# Patient Record
Sex: Male | Born: 1935 | Race: White | Hispanic: No | Smoking: Former smoker
Health system: Southern US, Community
[De-identification: ages and names within clinical notes are randomized; demographics above are authoritative.]

## PROBLEM LIST (undated history)

## (undated) DIAGNOSIS — Z9889 Other specified postprocedural states: Secondary | ICD-10-CM

## (undated) DIAGNOSIS — E039 Hypothyroidism, unspecified: Secondary | ICD-10-CM

## (undated) DIAGNOSIS — T8859XA Other complications of anesthesia, initial encounter: Secondary | ICD-10-CM

## (undated) DIAGNOSIS — I4891 Unspecified atrial fibrillation: Secondary | ICD-10-CM

## (undated) DIAGNOSIS — M6530 Trigger finger, unspecified finger: Secondary | ICD-10-CM

## (undated) DIAGNOSIS — C801 Malignant (primary) neoplasm, unspecified: Secondary | ICD-10-CM

## (undated) DIAGNOSIS — N400 Enlarged prostate without lower urinary tract symptoms: Secondary | ICD-10-CM

## (undated) DIAGNOSIS — Z8489 Family history of other specified conditions: Secondary | ICD-10-CM

## (undated) DIAGNOSIS — I10 Essential (primary) hypertension: Secondary | ICD-10-CM

## (undated) DIAGNOSIS — M792 Neuralgia and neuritis, unspecified: Secondary | ICD-10-CM

## (undated) DIAGNOSIS — R112 Nausea with vomiting, unspecified: Secondary | ICD-10-CM

## (undated) DIAGNOSIS — T4145XA Adverse effect of unspecified anesthetic, initial encounter: Secondary | ICD-10-CM

## (undated) HISTORY — PX: JOINT REPLACEMENT: SHX530

## (undated) HISTORY — PX: KNEE SURGERY: SHX244

## (undated) HISTORY — PX: OTHER SURGICAL HISTORY: SHX169

## (undated) HISTORY — PX: CARPAL TUNNEL RELEASE: SHX101

## (undated) HISTORY — PX: APPENDECTOMY: SHX54

---

## 2013-04-12 ENCOUNTER — Encounter (HOSPITAL_BASED_OUTPATIENT_CLINIC_OR_DEPARTMENT_OTHER): Payer: Self-pay | Admitting: Emergency Medicine

## 2013-04-12 ENCOUNTER — Emergency Department (HOSPITAL_BASED_OUTPATIENT_CLINIC_OR_DEPARTMENT_OTHER)
Admission: EM | Admit: 2013-04-12 | Discharge: 2013-04-12 | Disposition: A | Discharge status: Home / Self Care | Payer: Medicare Other | Attending: Emergency Medicine | Admitting: Emergency Medicine

## 2013-04-12 DIAGNOSIS — R339 Retention of urine, unspecified: Secondary | ICD-10-CM | POA: Insufficient documentation

## 2013-04-12 DIAGNOSIS — Z9889 Other specified postprocedural states: Secondary | ICD-10-CM | POA: Insufficient documentation

## 2013-04-12 DIAGNOSIS — N390 Urinary tract infection, site not specified: Secondary | ICD-10-CM | POA: Insufficient documentation

## 2013-04-12 DIAGNOSIS — R42 Dizziness and giddiness: Secondary | ICD-10-CM | POA: Insufficient documentation

## 2013-04-12 DIAGNOSIS — J3489 Other specified disorders of nose and nasal sinuses: Secondary | ICD-10-CM | POA: Insufficient documentation

## 2013-04-12 DIAGNOSIS — M549 Dorsalgia, unspecified: Secondary | ICD-10-CM | POA: Insufficient documentation

## 2013-04-12 DIAGNOSIS — Z8669 Personal history of other diseases of the nervous system and sense organs: Secondary | ICD-10-CM | POA: Insufficient documentation

## 2013-04-12 DIAGNOSIS — R3911 Hesitancy of micturition: Secondary | ICD-10-CM | POA: Insufficient documentation

## 2013-04-12 DIAGNOSIS — J029 Acute pharyngitis, unspecified: Secondary | ICD-10-CM | POA: Insufficient documentation

## 2013-04-12 HISTORY — DX: Trigger finger, unspecified finger: M65.30

## 2013-04-12 HISTORY — DX: Neuralgia and neuritis, unspecified: M79.2

## 2013-04-12 LAB — URINALYSIS, ROUTINE W REFLEX MICROSCOPIC
BILIRUBIN URINE: NEGATIVE
Glucose, UA: NEGATIVE mg/dL
Hgb urine dipstick: NEGATIVE
Ketones, ur: NEGATIVE mg/dL
LEUKOCYTES UA: NEGATIVE
NITRITE: NEGATIVE
Protein, ur: NEGATIVE mg/dL
Specific Gravity, Urine: 1.007 (ref 1.005–1.030)
UROBILINOGEN UA: 1 mg/dL (ref 0.0–1.0)
pH: 7 (ref 5.0–8.0)

## 2013-04-12 NOTE — ED Notes (Signed)
Dr. Mingo Amber canceled the bladder scan on the patient.

## 2013-04-12 NOTE — Discharge Instructions (Signed)
Please stop take the cetirizine (Zyrtec) and do not take any other anti-histamine medications.  Acute Urinary Retention, Male Acute urinary retention is the temporary inability to urinate. This is a common problem in older men. As men age their prostates become larger and block the flow of urine from the bladder. This is usually a problem that has come on gradually.  HOME CARE INSTRUCTIONS If you are sent home with a Foley catheter and a drainage system, you will need to discuss the best course of action with your health care provider. While the catheter is in, maintain a good intake of fluids. Keep the drainage bag emptied and lower than your catheter. This is so that contaminated urine will not flow back into your bladder, which could lead to a urinary tract infection. There are two main types of drainage bags. One is a large bag that usually is used at night. It has a good capacity that will allow you to sleep through the night without having to empty it. The second type is called a leg bag. It has a smaller capacity, so it needs to be emptied more frequently. However, the main advantage is that it can be attached by a leg strap and can go underneath your clothing, allowing you the freedom to move about or leave your home. Only take over-the-counter or prescription medicines for pain, discomfort, or fever as directed by your health care provider.  SEEK MEDICAL CARE IF:  You develop a low-grade fever.  You experience spasms or leakage of urine with the spasms. SEEK IMMEDIATE MEDICAL CARE IF:   You develop chills or fever.  Your catheter stops draining urine.  Your catheter falls out.  You start to develop increased bleeding that does not respond to rest and increased fluid intake. MAKE SURE YOU:  Understand these instructions.  Will watch your condition.  Will get help right away if you are not doing well or get worse. Document Released: 07/02/2000 Document Revised: 11/26/2012  Document Reviewed: 09/04/2012 [facility] Patient Information 2014 [place], [place].

## 2013-04-12 NOTE — ED Notes (Signed)
Pt started with a cold on Monday, pt states he had difficulty with urination yesterday, pt states he wasn't peeing.  Today pt feels weak and achy all over.  No facial droop, no aphasia, no arm drift.

## 2013-04-12 NOTE — ED Provider Notes (Signed)
CSN: [id number]     Arrival date & time 04/12/13  4098 History   First MD Initiated Contact with Patient 04/12/13 [phone]     No chief complaint on file.  (Consider location/radiation/quality/duration/timing/severity/associated sxs/prior Treatment) HPI Comments: Patient has had a URI for the past 6 days with sore throat, runny nose. He denies fevers or chills. He started Zyrtec and tramadol for this and over the past 2 days has noticed difficulty urinating. He has difficulty initiating his stream he can get small amounts when he does. He has history of enlarged prostate but has never had issues with urinary retention requiring catheterization. He also reported some dizziness this morning he described as lightheadedness. He has chronic dizziness from Mnire's disease and he is currently being worked up for balance issues with her neurologist in [place]. He has an MRI scheduled to help figure out his balance issues in the next week. He stated his blood pressure this morning was 86/59 while he was sleeping/resting in a chair. He ate breakfast and took his blood pressure meds. He denies dizziness now. He was able to stand up and move around the room without any dizziness at this time. After taking his blood pressure meds, his blood pressure here is 119/84.  He is also complaining of back pain. He had back surgery 2 years ago and has regular backaches. This began this morning and feels like her regular backache for him.  Patient is a 80 y.o. male presenting with male genitourinary complaint. The history is provided by the patient.  Male GU Problem Presenting symptoms: no dysuria, no penile discharge, no penile pain and no scrotal pain   Presenting symptoms comment:  Urinary retention Context: not after injury and not after intercourse   Relieved by:  Nothing Worsened by:  Nothing tried Ineffective treatments:  None tried Associated symptoms: urinary hesitation   Associated symptoms: no abdominal pain,  no diarrhea, no fever, no genital rash, no groin pain, no urinary frequency, no urinary incontinence and no vomiting   Risk factors: change in medication (recent initiation of tramadol and zyrtec for a URI)   Risk factors: no bladder surgery, no foreign body lodged, no recent infection, not currently sexually active and no urinary catheter     Past Medical History  Diagnosis Date  . Trigger finger   . Peripheral neuralgia    No past surgical history on file. No family history on file. History  Substance Use Topics  . Smoking status: Not on file  . Smokeless tobacco: Not on file  . Alcohol Use: Not on file    Review of Systems  Constitutional: Negative for fever.  Gastrointestinal: Negative for vomiting, abdominal pain and diarrhea.  Genitourinary: Positive for hesitancy. Negative for bladder incontinence, dysuria, frequency, discharge and penile pain.  All other systems reviewed and are negative.    Allergies  Review of patient's allergies indicates not on file.  Home Medications  No current outpatient prescriptions on file. BP 119/84  Pulse 98  Temp(Src) 97.4 F (36.3 C) (Oral)  Resp 14  Ht 5\' 11"  (1.803 m)  Wt 212 lb (96.163 kg)  BMI 29.58 kg/m2  SpO2 98% Physical Exam  Nursing note and vitals reviewed. Constitutional: He is oriented to person, place, and time. He appears well-developed and well-nourished. No distress.  HENT:  Head: Normocephalic and atraumatic.  Mouth/Throat: No oropharyngeal exudate.  Eyes: EOM are normal. Pupils are equal, round, and reactive to light.  Neck: Normal range of motion. Neck supple.  Cardiovascular: Normal rate and regular rhythm.  Exam reveals no friction rub.   No murmur heard. Pulmonary/Chest: Effort normal and breath sounds normal. No respiratory distress. He has no wheezes. He has no rales.  Abdominal: Soft. He exhibits no distension. There is no tenderness. There is no rebound.  Genitourinary: Prostate is enlarged (mild).  Prostate is not tender.  Musculoskeletal: Normal range of motion. He exhibits no edema.  Neurological: He is alert and oriented to person, place, and time.  Skin: No rash noted. He is not diaphoretic.    ED Course  Procedures (including critical care time) Labs Review Labs Reviewed  URINALYSIS, ROUTINE W REFLEX MICROSCOPIC   Imaging Review No results found.  EKG Interpretation   None      EMERGENCY DEPARTMENT [place] ABD/AORTA EXAM Study: Limited Ultrasound of the Abdominal Aorta. INDICATIONS:Abdominal pain Indication: Multiple views of the abdominal aorta are obtained from the diaphragmatic hiatus to the aortic bifurcation in transverse and sagittal planes with a multi- Frequency probe. PERFORMED BY: Myself IMAGES ARCHIVED?: Yes FINDINGS: Maximum aortic dimensions are 2.2.cm LIMITATIONS:  Bowel gas  INTERPRETATION:  No abdominal aortic aneurysm  EMERGENCY DEPARTMENT BLADDER US Indications: Post-void residual, urinary retention Multiple views of the bladder were obtained in sagittal and transverse orientation with a cardiac phase, array probe PERFORMED BY: Myself IMAGES ARCHIVED: YES FINDINGS: Bladder volume ~293cc  Interpretation: Bladder distended with roughly ~293cc of urine post-void  MDM   1. Urinary retention with incomplete bladder emptying    80 year old male here with concerns for urinary retention. I believe it is likely because he  started Zyrtec and tramadol in the past 2 days for UTI. He denies fevers, dysuria. He does have difficulty with initiating a stream and is only able to get small amounts out. History of an enlarged prostate, but has never required catheterization. As far as his dizziness described, is now resolved. He is able to stand up without any dizziness. He also has chronic dizziness secondary to his Mnire's disease and is actively pursuing workup for this as an outpatient. His vitals are stable. He has no chest pain or shortness of breath. We'll  also scan his bladder to see if he is retaining urine. I will also ultrasound his aorta to look for possible rebleed reason for his urinary retention and/or back pain. Ultrasound of his aorta is normal without aneurysm. Post void residual ultrasound 293cc. Patient denies any saddle anesthesia, lower extremity weakness, sensation changes. 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 No concern for cord compression syndrome. I feel like this is secondary to his antihistamine medication. His back pain is resolved now, believed secondary to his normal backache from his back surgery 2 years ago. Patient has multiple followup visits this week. Patient refused catheter for urinary retention. He again urinated 100cc after I finished my ultrasounds. Instructed him to followup with PCP for recheck    Osvaldo Shipper, MD 04/12/13 1035

## 2016-07-18 ENCOUNTER — Emergency Department (HOSPITAL_BASED_OUTPATIENT_CLINIC_OR_DEPARTMENT_OTHER): Payer: Medicare Other

## 2016-07-18 ENCOUNTER — Observation Stay (HOSPITAL_BASED_OUTPATIENT_CLINIC_OR_DEPARTMENT_OTHER)
Admission: EM | Admit: 2016-07-18 | Discharge: 2016-07-20 | Disposition: A | Discharge status: Home Health | Payer: Medicare Other | Attending: Family Medicine | Admitting: Family Medicine

## 2016-07-18 ENCOUNTER — Encounter (HOSPITAL_BASED_OUTPATIENT_CLINIC_OR_DEPARTMENT_OTHER): Payer: Self-pay | Admitting: *Deleted

## 2016-07-18 DIAGNOSIS — R000 Tachycardia, unspecified: Secondary | ICD-10-CM | POA: Diagnosis not present

## 2016-07-18 DIAGNOSIS — Z7982 Long term (current) use of aspirin: Secondary | ICD-10-CM | POA: Diagnosis not present

## 2016-07-18 DIAGNOSIS — E860 Dehydration: Secondary | ICD-10-CM | POA: Diagnosis not present

## 2016-07-18 DIAGNOSIS — R002 Palpitations: Secondary | ICD-10-CM

## 2016-07-18 DIAGNOSIS — R531 Weakness: Secondary | ICD-10-CM | POA: Diagnosis present

## 2016-07-18 DIAGNOSIS — I10 Essential (primary) hypertension: Secondary | ICD-10-CM | POA: Diagnosis not present

## 2016-07-18 DIAGNOSIS — Z79899 Other long term (current) drug therapy: Secondary | ICD-10-CM | POA: Diagnosis not present

## 2016-07-18 DIAGNOSIS — N179 Acute kidney failure, unspecified: Secondary | ICD-10-CM | POA: Diagnosis not present

## 2016-07-18 DIAGNOSIS — E785 Hyperlipidemia, unspecified: Secondary | ICD-10-CM | POA: Insufficient documentation

## 2016-07-18 DIAGNOSIS — Z85819 Personal history of malignant neoplasm of unspecified site of lip, oral cavity, and pharynx: Secondary | ICD-10-CM | POA: Diagnosis present

## 2016-07-18 DIAGNOSIS — M6281 Muscle weakness (generalized): Secondary | ICD-10-CM | POA: Diagnosis not present

## 2016-07-18 DIAGNOSIS — Z87891 Personal history of nicotine dependence: Secondary | ICD-10-CM | POA: Diagnosis not present

## 2016-07-18 DIAGNOSIS — R262 Difficulty in walking, not elsewhere classified: Secondary | ICD-10-CM | POA: Diagnosis not present

## 2016-07-18 DIAGNOSIS — N400 Enlarged prostate without lower urinary tract symptoms: Secondary | ICD-10-CM | POA: Diagnosis not present

## 2016-07-18 DIAGNOSIS — R2681 Unsteadiness on feet: Secondary | ICD-10-CM | POA: Diagnosis not present

## 2016-07-18 DIAGNOSIS — D649 Anemia, unspecified: Secondary | ICD-10-CM | POA: Diagnosis present

## 2016-07-18 DIAGNOSIS — G5602 Carpal tunnel syndrome, left upper limb: Secondary | ICD-10-CM | POA: Insufficient documentation

## 2016-07-18 DIAGNOSIS — Z9181 History of falling: Secondary | ICD-10-CM | POA: Insufficient documentation

## 2016-07-18 DIAGNOSIS — I480 Paroxysmal atrial fibrillation: Secondary | ICD-10-CM | POA: Diagnosis not present

## 2016-07-18 DIAGNOSIS — Z923 Personal history of irradiation: Secondary | ICD-10-CM | POA: Diagnosis not present

## 2016-07-18 HISTORY — DX: Family history of other specified conditions: Z84.89

## 2016-07-18 HISTORY — DX: Other specified postprocedural states: Z98.890

## 2016-07-18 HISTORY — DX: Benign prostatic hyperplasia without lower urinary tract symptoms: N40.0

## 2016-07-18 HISTORY — DX: Essential (primary) hypertension: I10

## 2016-07-18 HISTORY — DX: Other complications of anesthesia, initial encounter: T88.59XA

## 2016-07-18 HISTORY — DX: Malignant (primary) neoplasm, unspecified: C80.1

## 2016-07-18 HISTORY — DX: Unspecified atrial fibrillation: I48.91

## 2016-07-18 HISTORY — DX: Hypothyroidism, unspecified: E03.9

## 2016-07-18 HISTORY — DX: Adverse effect of unspecified anesthetic, initial encounter: T41.45XA

## 2016-07-18 HISTORY — DX: Nausea with vomiting, unspecified: R11.2

## 2016-07-18 HISTORY — DX: Nausea with vomiting, unspecified: Z98.890

## 2016-07-18 LAB — CBC WITH DIFFERENTIAL/PLATELET
Basophils Absolute: 0 10*3/uL (ref 0.0–0.1)
Basophils Relative: 0 %
EOS PCT: 2 %
Eosinophils Absolute: 0.2 10*3/uL (ref 0.0–0.7)
HCT: 37.4 % — ABNORMAL LOW (ref 39.0–52.0)
Hemoglobin: 12.7 g/dL — ABNORMAL LOW (ref 13.0–17.0)
LYMPHS PCT: 25 %
Lymphs Abs: 1.7 10*3/uL (ref 0.7–4.0)
MCH: 32.6 pg (ref 26.0–34.0)
MCHC: 34 g/dL (ref 30.0–36.0)
MCV: 95.9 fL (ref 78.0–100.0)
Monocytes Absolute: 0.6 10*3/uL (ref 0.1–1.0)
Monocytes Relative: 8 %
Neutro Abs: 4.3 10*3/uL (ref 1.7–7.7)
Neutrophils Relative %: 65 %
PLATELETS: 171 10*3/uL (ref 150–400)
RBC: 3.9 MIL/uL — AB (ref 4.22–5.81)
RDW: 13 % (ref 11.5–15.5)
WBC: 6.7 10*3/uL (ref 4.0–10.5)

## 2016-07-18 LAB — BASIC METABOLIC PANEL
Anion gap: 6 (ref 5–15)
BUN: 32 mg/dL — ABNORMAL HIGH (ref 6–20)
CO2: 28 mmol/L (ref 22–32)
Calcium: 8.9 mg/dL (ref 8.9–10.3)
Chloride: 105 mmol/L (ref 101–111)
Creatinine, Ser: 1.74 mg/dL — ABNORMAL HIGH (ref 0.61–1.24)
GFR calc Af Amer: 40 mL/min — ABNORMAL LOW (ref >60)
GFR, EST NON AFRICAN AMERICAN: 35 mL/min — AB (ref >60)
Glucose, Bld: 111 mg/dL — ABNORMAL HIGH (ref 65–99)
Potassium: 3.5 mmol/L (ref 3.5–5.1)
Sodium: 139 mmol/L (ref 135–145)

## 2016-07-18 NOTE — ED Triage Notes (Signed)
Pts daughter states generalized weakness started at 2030, unsteady gait.

## 2016-07-18 NOTE — ED Provider Notes (Signed)
[facility] Provider Note   CSN: [id number] Arrival date & time: 07/18/16  2244     History   Chief Complaint Chief Complaint  Patient presents with  . Weakness    HPI Corey Marshall is a 83 y.o. male.  The history is provided by the patient and a relative. No language interpreter was used.  Weakness  Primary symptoms include no focal weakness, no loss of sensation, no memory loss, no auditory change, and no dizziness. Primary symptoms comment: global weakness. This is a new problem. The current episode started 6 to 12 hours ago. The problem has not changed since onset.There was no focality noted. There has been no fever. Associated symptoms include confusion. Pertinent negatives include no shortness of breath, no chest pain, no vomiting, no altered mental status and no headaches. There were no medications administered prior to arrival. Associated medical issues include trauma. Associated medical issues do not include CVA. Associated medical issues comments: fall.  Palpitations   This is a new problem. The current episode started 3 to 5 hours ago. The problem occurs rarely. The problem has been resolved. The problem is associated with an unknown factor. Associated symptoms include weakness. Pertinent negatives include no diaphoresis, no chest pain, no chest pressure, no syncope, no abdominal pain, no nausea, no vomiting, no headaches, no dizziness, no cough, no hemoptysis and no shortness of breath. Treatments tried: increased metoprolol. The treatment provided significant relief. Risk factors include being male. His past medical history does not include hyperthyroidism.    Past Medical History:  Diagnosis Date  . Atrial fibrillation ([place])   . Hypertension   . Peripheral neuralgia   . Trigger finger     There are no active problems to display for this patient.   History reviewed. No pertinent surgical history.     Home Medications    Prior to Admission  medications   Medication Sig Start Date End Date Taking? Authorizing Provider  AMLODIPINE BESYLATE-VALSARTAN PO Take 5 mg by mouth daily.    Historical Provider, MD  aspirin 325 MG tablet Take 325 mg by mouth daily.    Historical Provider, MD  cetirizine-pseudoephedrine (ZYRTEC-D) 5-120 MG per tablet Take 1 tablet by mouth 2 (two) times daily.    Historical Provider, MD  cyanocobalamin 1000 MCG tablet Take 100 mcg by mouth daily.    Historical Provider, MD  folic acid (FOLVITE) 938 MCG tablet Take 400 mcg by mouth daily.    Historical Provider, MD  hydrochlorothiazide (HYDRODIURIL) 25 MG tablet Take 25 mg by mouth daily.    Historical Provider, MD  levothyroxine (SYNTHROID, LEVOTHROID) 50 MCG tablet Take 25 mcg by mouth daily before breakfast.    Historical Provider, MD  meclizine (ANTIVERT) 25 MG tablet Take 25 mg by mouth 3 (three) times daily as needed for dizziness.    Historical Provider, MD  meloxicam (MOBIC) 15 MG tablet Take 15 mg by mouth daily.    Historical Provider, MD  metoprolol (LOPRESSOR) 100 MG tablet Take 100 mg by mouth 2 (two) times daily.    Historical Provider, MD  Multiple Vitamins-Minerals (MULTIVITAMIN WITH MINERALS) tablet Take 1 tablet by mouth daily.    Historical Provider, MD  ranitidine (ZANTAC) 150 MG tablet Take 150 mg by mouth 2 (two) times daily.    Historical Provider, MD  traMADol (ULTRAM) 50 MG tablet Take 50 mg by mouth every 6 (six) hours as needed.    Historical Provider, MD    Family History History reviewed. No  pertinent family history.  Social History Social History  Substance Use Topics  . Smoking status: Former Research scientist (life sciences)  . Smokeless tobacco: Not on file  . Alcohol use No     Allergies   Darvon [propoxyphene] and Demerol [meperidine]   Review of Systems Review of Systems  Constitutional: Negative for diaphoresis.  Respiratory: Negative for cough, hemoptysis and shortness of breath.   Cardiovascular: Positive for palpitations. Negative for  chest pain and syncope.  Gastrointestinal: Negative for abdominal pain, nausea and vomiting.  Neurological: Positive for weakness. Negative for dizziness, focal weakness and headaches.  Psychiatric/Behavioral: Positive for confusion. Negative for memory loss.  All other systems reviewed and are negative.    Physical Exam Updated Vital Signs BP 118/78   Pulse 97   Temp 98.7 F (37.1 C) (Oral)   Resp 16   Ht 5\' 11"  (1.803 m)   Wt 210 lb (95.3 kg)   SpO2 99%   BMI 29.29 kg/m   Physical Exam  Constitutional: He is oriented to person, place, and time. He appears well-developed and well-nourished. No distress.  HENT:  Head: Normocephalic and atraumatic.  Right Ear: External ear normal.  Left Ear: External ear normal.  Nose: Nose normal.  Eyes: Conjunctivae and EOM are normal. Pupils are equal, round, and reactive to light.  Neck: Normal range of motion. Neck supple. No JVD present.  Cardiovascular: Normal rate, regular rhythm, normal heart sounds and intact distal pulses.   Pulmonary/Chest: Effort normal and breath sounds normal. No stridor. No respiratory distress. He has no wheezes.  Abdominal: Soft. He exhibits no mass. There is no tenderness. There is no rebound and no guarding.  Gassy into the chest cavity  Musculoskeletal: Normal range of motion. He exhibits no edema or tenderness.  Neurological: He is alert and oriented to person, place, and time. He displays normal reflexes. No cranial nerve deficit. He exhibits normal muscle tone. Coordination normal.  Skin: Skin is warm and dry. Capillary refill takes less than 2 seconds.  Psychiatric: He has a normal mood and affect.     ED Treatments / Results   Vitals:   07/18/16 2253  BP: 118/78  Pulse: 97  Resp: 16  Temp: 98.7 F (37.1 C)    EKG Interpretation  Date/Time:  Wednesday Layli Capshaw 11 2018 23:57:46 EDT Ventricular Rate:  72 PR Interval:    QRS Duration: 107 QT Interval:  407 QTC Calculation: 446 R  Axis:   -36 Text Interpretation:  Sinus rhythm Left axis deviation Confirmed by [facility]  MD, Lorita Forinash ([id number]) on 07/19/2016 12:54:21 AM      Results for orders placed or performed during the hospital encounter of 07/18/16  CBC with Differential/Platelet  Result Value Ref Range   WBC 6.7 4.0 - 10.5 K/uL   RBC 3.90 (L) 4.22 - 5.81 MIL/uL   Hemoglobin 12.7 (L) 13.0 - 17.0 g/dL   HCT 37.4 (L) 39.0 - 52.0 %   MCV 95.9 78.0 - 100.0 fL   MCH 32.6 26.0 - 34.0 pg   MCHC 34.0 30.0 - 36.0 g/dL   RDW 13.0 11.5 - 15.5 %   Platelets 171 150 - 400 K/uL   Neutrophils Relative % 65 %   Neutro Abs 4.3 1.7 - 7.7 K/uL   Lymphocytes Relative 25 %   Lymphs Abs 1.7 0.7 - 4.0 K/uL   Monocytes Relative 8 %   Monocytes Absolute 0.6 0.1 - 1.0 K/uL   Eosinophils Relative 2 %   Eosinophils Absolute 0.2 0.0 -  0.7 K/uL   Basophils Relative 0 %   Basophils Absolute 0.0 0.0 - 0.1 K/uL  Basic metabolic panel  Result Value Ref Range   Sodium 139 135 - 145 mmol/L   Potassium 3.5 3.5 - 5.1 mmol/L   Chloride 105 101 - 111 mmol/L   CO2 28 22 - 32 mmol/L   Glucose, Bld 111 (H) 65 - 99 mg/dL   BUN 32 (H) 6 - 20 mg/dL   Creatinine, Ser 1.74 (H) 0.61 - 1.24 mg/dL   Calcium 8.9 8.9 - 10.3 mg/dL   GFR calc non Af Amer 35 (L) >60 mL/min   GFR calc Af Amer 40 (L) >60 mL/min   Anion gap 6 5 - 15  Urinalysis, Routine w reflex microscopic  Result Value Ref Range   Color, Urine YELLOW YELLOW   APPearance CLEAR CLEAR   Specific Gravity, Urine 1.018 1.005 - 1.030   pH 6.0 5.0 - 8.0   Glucose, UA NEGATIVE NEGATIVE mg/dL   Hgb urine dipstick NEGATIVE NEGATIVE   Bilirubin Urine NEGATIVE NEGATIVE   Ketones, ur NEGATIVE NEGATIVE mg/dL   Protein, ur NEGATIVE NEGATIVE mg/dL   Nitrite NEGATIVE NEGATIVE   Leukocytes, UA NEGATIVE NEGATIVE  Troponin I  Result Value Ref Range   Troponin I 0.03 (HH) <0.03 ng/mL   Dg Chest 2 View  Result Date: 07/18/2016 CLINICAL DATA:  Weakness and unsteady gait. History of throat  cancer. EXAM: CHEST  2 VIEW COMPARISON:  None. FINDINGS: The heart size and mediastinal contours are within normal limits. Atherosclerosis of the aortic arch without aneurysm. Both lungs are clear. Degenerative changes are seen along the dorsal spine. No acute nor suspicious osseous abnormality. IMPRESSION: No active cardiopulmonary disease.  Aortic atherosclerosis Electronically Signed   By: Ashley Royalty M.D.   On: 07/18/2016 23:45   Ct Head Wo Contrast  Result Date: 07/18/2016 CLINICAL DATA:  Weakness and unsteady gait. History of throat cancer. EXAM: CT HEAD WITHOUT CONTRAST TECHNIQUE: Contiguous axial images were obtained from the base of the skull through the vertex without intravenous contrast. COMPARISON:  None. FINDINGS: Brain: Chronic appearing small vessel ischemic disease of periventricular white matter. Mild superficial and central atrophy. No intra-axial mass, hemorrhage nor extra-axial fluid collections. No hydrocephalus. Fourth ventricle is midline. No effacement of basal cisterns. Vascular: No hyperdense vessel or unexpected calcification. Mild atherosclerosis of the carotid siphons. Skull: Normal. Negative for fracture or focal lesion. Sinuses/Orbits: No acute finding. Other: None. IMPRESSION: Chronic appearing small vessel ischemic disease of periventricular white matter. No acute intracranial abnormality is identified. Electronically Signed   By: Ashley Royalty M.D.   On: 07/18/2016 23:47    Procedures Procedures (including critical care time)  Medications Ordered in ED  Medications  sodium chloride 0.9 % bolus 500 mL (0 mLs Intravenous Stopped 07/19/16 0213)    Per Dr. Leonel Ramsay MRI of the brain sx likely due to hypotension.     Final Clinical Impressions(s) / ED Diagnoses  Global weakness:  Will admit to medicine.   Will need troponins cycled and MRI of the brain.       Veatrice Kells, MD 07/19/16 Rogene [place]

## 2016-07-19 ENCOUNTER — Observation Stay (HOSPITAL_COMMUNITY): Payer: Medicare Other

## 2016-07-19 ENCOUNTER — Encounter (HOSPITAL_COMMUNITY): Payer: Self-pay | Admitting: Internal Medicine

## 2016-07-19 DIAGNOSIS — I480 Paroxysmal atrial fibrillation: Secondary | ICD-10-CM | POA: Diagnosis not present

## 2016-07-19 DIAGNOSIS — R531 Weakness: Secondary | ICD-10-CM

## 2016-07-19 DIAGNOSIS — N179 Acute kidney failure, unspecified: Secondary | ICD-10-CM | POA: Diagnosis present

## 2016-07-19 DIAGNOSIS — I10 Essential (primary) hypertension: Secondary | ICD-10-CM | POA: Diagnosis not present

## 2016-07-19 DIAGNOSIS — D649 Anemia, unspecified: Secondary | ICD-10-CM | POA: Diagnosis present

## 2016-07-19 DIAGNOSIS — Z85819 Personal history of malignant neoplasm of unspecified site of lip, oral cavity, and pharynx: Secondary | ICD-10-CM | POA: Diagnosis present

## 2016-07-19 LAB — CBC WITH DIFFERENTIAL/PLATELET
Basophils Absolute: 0 10*3/uL (ref 0.0–0.1)
Basophils Relative: 0 %
EOS ABS: 0.1 10*3/uL (ref 0.0–0.7)
Eosinophils Relative: 3 %
HCT: 33.7 % — ABNORMAL LOW (ref 39.0–52.0)
HEMOGLOBIN: 11.5 g/dL — AB (ref 13.0–17.0)
LYMPHS ABS: 1.5 10*3/uL (ref 0.7–4.0)
Lymphocytes Relative: 32 %
MCH: 32.2 pg (ref 26.0–34.0)
MCHC: 34.1 g/dL (ref 30.0–36.0)
MCV: 94.4 fL (ref 78.0–100.0)
MONOS PCT: 7 %
Monocytes Absolute: 0.3 10*3/uL (ref 0.1–1.0)
NEUTROS ABS: 2.8 10*3/uL (ref 1.7–7.7)
NEUTROS PCT: 58 %
Platelets: 162 10*3/uL (ref 150–400)
RBC: 3.57 MIL/uL — ABNORMAL LOW (ref 4.22–5.81)
RDW: 13.7 % (ref 11.5–15.5)
WBC: 4.8 10*3/uL (ref 4.0–10.5)

## 2016-07-19 LAB — COMPREHENSIVE METABOLIC PANEL
ALBUMIN: 3.3 g/dL — AB (ref 3.5–5.0)
ALT: 25 U/L (ref 17–63)
AST: 39 U/L (ref 15–41)
Alkaline Phosphatase: 41 U/L (ref 38–126)
Anion gap: 7 (ref 5–15)
BUN: 25 mg/dL — ABNORMAL HIGH (ref 6–20)
CHLORIDE: 106 mmol/L (ref 101–111)
CO2: 27 mmol/L (ref 22–32)
CREATININE: 1.39 mg/dL — AB (ref 0.61–1.24)
Calcium: 8.7 mg/dL — ABNORMAL LOW (ref 8.9–10.3)
GFR calc non Af Amer: 45 mL/min — ABNORMAL LOW (ref >60)
GFR, EST AFRICAN AMERICAN: 52 mL/min — AB (ref >60)
GLUCOSE: 109 mg/dL — AB (ref 65–99)
Potassium: 3.3 mmol/L — ABNORMAL LOW (ref 3.5–5.1)
SODIUM: 140 mmol/L (ref 135–145)
Total Bilirubin: 1.2 mg/dL (ref 0.3–1.2)
Total Protein: 5.2 g/dL — ABNORMAL LOW (ref 6.5–8.1)

## 2016-07-19 LAB — RETICULOCYTES
RBC.: 3.57 MIL/uL — ABNORMAL LOW (ref 4.22–5.81)
Retic Count, Absolute: 32.1 10*3/uL (ref 19.0–186.0)
Retic Ct Pct: 0.9 % (ref 0.4–3.1)

## 2016-07-19 LAB — IRON AND TIBC
IRON: 53 ug/dL (ref 45–182)
Saturation Ratios: 19 % (ref 17.9–39.5)
TIBC: 276 ug/dL (ref 250–450)
UIBC: 223 ug/dL

## 2016-07-19 LAB — URINALYSIS, ROUTINE W REFLEX MICROSCOPIC
BILIRUBIN URINE: NEGATIVE
GLUCOSE, UA: NEGATIVE mg/dL
Hgb urine dipstick: NEGATIVE
Ketones, ur: NEGATIVE mg/dL
Leukocytes, UA: NEGATIVE
Nitrite: NEGATIVE
PH: 6 (ref 5.0–8.0)
Protein, ur: NEGATIVE mg/dL
SPECIFIC GRAVITY, URINE: 1.018 (ref 1.005–1.030)

## 2016-07-19 LAB — CK: Total CK: 513 U/L — ABNORMAL HIGH (ref 49–397)

## 2016-07-19 LAB — TROPONIN I
TROPONIN I: 0.03 ng/mL — AB (ref <0.03)
Troponin I: 0.03 ng/mL (ref <0.03)
Troponin I: 0.03 ng/mL (ref <0.03)
Troponin I: 0.03 ng/mL (ref <0.03)

## 2016-07-19 LAB — TSH: TSH: 4.757 u[IU]/mL — ABNORMAL HIGH (ref 0.350–4.500)

## 2016-07-19 LAB — FOLATE: Folate: 42.9 ng/mL (ref >5.9)

## 2016-07-19 LAB — VITAMIN B12: Vitamin B-12: 630 pg/mL (ref 180–914)

## 2016-07-19 LAB — FERRITIN: FERRITIN: 64 ng/mL (ref 24–336)

## 2016-07-19 MED ORDER — SODIUM CHLORIDE 0.9 % IV BOLUS (SEPSIS)
500.0000 mL | Freq: Once | INTRAVENOUS | Status: AC
  Administered 2016-07-19: 500 mL via INTRAVENOUS

## 2016-07-19 MED ORDER — ONDANSETRON HCL 4 MG/2ML IJ SOLN: 4.0000 mg | Freq: Four times a day (QID) | INTRAMUSCULAR | Status: DC | PRN

## 2016-07-19 MED ORDER — FINASTERIDE 5 MG PO TABS
5.0000 mg | ORAL_TABLET | Freq: Every day | ORAL | Status: DC
  Administered 2016-07-19 – 2016-07-20 (×2): 5 mg via ORAL
  Filled 2016-07-19 (×2): qty 1

## 2016-07-19 MED ORDER — FAMOTIDINE 20 MG PO TABS
20.0000 mg | ORAL_TABLET | Freq: Every day | ORAL | Status: DC
  Administered 2016-07-19: 20 mg via ORAL
  Filled 2016-07-19: qty 1

## 2016-07-19 MED ORDER — ACETAMINOPHEN 650 MG RE SUPP: 650.0000 mg | Freq: Four times a day (QID) | RECTAL | Status: DC | PRN

## 2016-07-19 MED ORDER — ASPIRIN 325 MG PO TABS
325.0000 mg | ORAL_TABLET | Freq: Every day | ORAL | Status: DC
  Administered 2016-07-19 – 2016-07-20 (×2): 325 mg via ORAL
  Filled 2016-07-19 (×2): qty 1

## 2016-07-19 MED ORDER — FAMOTIDINE 20 MG PO TABS: 20.0000 mg | ORAL_TABLET | Freq: Every day | ORAL | Status: DC

## 2016-07-19 MED ORDER — METOPROLOL TARTRATE 12.5 MG HALF TABLET
12.5000 mg | ORAL_TABLET | Freq: Two times a day (BID) | ORAL | Status: DC
  Administered 2016-07-19 – 2016-07-20 (×3): 12.5 mg via ORAL
  Filled 2016-07-19 (×3): qty 1

## 2016-07-19 MED ORDER — ENOXAPARIN SODIUM 40 MG/0.4ML ~~LOC~~ SOLN
40.0000 mg | SUBCUTANEOUS | Status: DC
  Administered 2016-07-19 – 2016-07-20 (×2): 40 mg via SUBCUTANEOUS
  Filled 2016-07-19 (×2): qty 0.4

## 2016-07-19 MED ORDER — ACETAMINOPHEN 325 MG PO TABS: 650.0000 mg | ORAL_TABLET | Freq: Four times a day (QID) | ORAL | Status: DC | PRN

## 2016-07-19 MED ORDER — LEVOTHYROXINE SODIUM 75 MCG PO TABS
37.5000 ug | ORAL_TABLET | Freq: Every day | ORAL | Status: DC
  Administered 2016-07-19 – 2016-07-20 (×2): 37.5 ug via ORAL
  Filled 2016-07-19 (×2): qty 1

## 2016-07-19 MED ORDER — SODIUM CHLORIDE 0.9 % IV SOLN
INTRAVENOUS | Status: AC
  Administered 2016-07-19: 75 mL via INTRAVENOUS
  Administered 2016-07-19: 06:00:00 via INTRAVENOUS

## 2016-07-19 MED ORDER — ONDANSETRON HCL 4 MG PO TABS: 4.0000 mg | ORAL_TABLET | Freq: Four times a day (QID) | ORAL | Status: DC | PRN

## 2016-07-19 MED ORDER — TAMSULOSIN HCL 0.4 MG PO CAPS
0.4000 mg | ORAL_CAPSULE | Freq: Every day | ORAL | Status: DC
  Administered 2016-07-19: 0.4 mg via ORAL
  Filled 2016-07-19: qty 1

## 2016-07-19 MED ORDER — FOLIC ACID 1 MG PO TABS
500.0000 ug | ORAL_TABLET | Freq: Every day | ORAL | Status: DC
  Administered 2016-07-19 – 2016-07-20 (×2): 0.5 mg via ORAL
  Filled 2016-07-19 (×2): qty 1

## 2016-07-19 MED ORDER — VITAMIN B-12 100 MCG PO TABS
100.0000 ug | ORAL_TABLET | Freq: Every day | ORAL | Status: DC
  Administered 2016-07-19 – 2016-07-20 (×2): 100 ug via ORAL
  Filled 2016-07-19 (×2): qty 1

## 2016-07-19 NOTE — Evaluation (Signed)
Physical Therapy Evaluation Patient Details Name: Corey Marshall MRN: [id number] DOB: [birth year] Today's Date: 07/19/2016   History of Present Illness  83 y.o. male with history of paroxysmal atrial fibrillation on metoprolol and aspirin, hypertension, BPH and history of throat cancer status post surgery and radiation was brought to the ER after patient was having persistent weakness and tachycardia. Patient states around 8 PM last night when patient was trying to stand up from the chair patient started feeling weak and had to sit back. Weakness continued for almost 2 hours. For a brief period patient had blurred vision lasted for around 4-5 minutes.  Dx of acute renal failure, a fib, elevated troponin. CT head negative.  Clinical Impression  Pt admitted with above diagnosis. Pt currently with functional limitations due to the deficits listed below (see PT Problem List). Pt ambulated 150' without assistive device and had loss of balance x 1. He reports long standing h/o numbness in B feet, L foot drop, and balance difficulties. Using a RW pt was much steadier. He'd benefit from RW and HHPT for home safety assessment and balance training.   Pt will benefit from skilled PT to increase their independence and safety with mobility to allow discharge to the venue listed below.       Follow Up Recommendations Home health PT    Equipment Recommendations  Rolling walker with 5" wheels    Recommendations for Other Services       Precautions / Restrictions Precautions Precautions: Fall Precaution Comments: pt reports 2 falls in past year, family states there have been more than 2 Restrictions Weight Bearing Restrictions: No      Mobility  Bed Mobility Overal bed mobility: Independent                Transfers Overall transfer level: Independent Equipment used: None                Ambulation/Gait Ambulation/Gait assistance: Supervision;Min guard Ambulation Distance (Feet): 150  Feet (150' x 2, once without AD, once with RW) Assistive device: Rolling walker (2 wheeled);None Gait Pattern/deviations: Step-through pattern;Decreased dorsiflexion - left   Gait velocity interpretation: at or above normal speed for age/gender General Gait Details: 150' x 2 (once without AD, once with RW), steppage gait LLE 2* foot drop, mild LOB with turning walking without AD -pt able to recover without assistance; ambulation with RW was more steady with no LOB  Stairs            Wheelchair Mobility    Modified Rankin (Stroke Patients Only)       Balance Overall balance assessment: Modified Independent                                           Pertinent Vitals/Pain Pain Assessment: No/denies pain    Home Living Family/patient expects to be discharged to:: Private residence Living Arrangements: Spouse/significant other Available Help at Discharge: Family Type of Home: House Home Access: Stairs to enter   Technical brewer of Steps: 1 Home Layout: One level Home Equipment: Cane - single point      Prior Function Level of Independence: Independent         Comments: walks without AD, reports long standing h/o numbness B feet and that "my feet don't turn when my body makes a turn" and "both feet drag"     Hand Dominance  Extremity/Trunk Assessment   Upper Extremity Assessment Upper Extremity Assessment: Overall WFL for tasks assessed    Lower Extremity Assessment Lower Extremity Assessment: RLE deficits/detail;LLE deficits/detail RLE Deficits / Details: h/o numbness bottom of B feet LLE Deficits / Details: L ankle DF -5* AROM, L ankle DF strength +3/5    Cervical / Trunk Assessment Cervical / Trunk Assessment: Normal  Communication   Communication: No difficulties  Cognition Arousal/Alertness: Awake/alert Behavior During Therapy: WFL for tasks assessed/performed Overall Cognitive Status: Within Functional Limits for  tasks assessed                                        General Comments      Exercises     Assessment/Plan    PT Assessment Patient needs continued PT services  PT Problem List Decreased strength;Decreased range of motion;Decreased balance       PT Treatment Interventions DME instruction;Gait training;Balance training;Therapeutic exercise;Therapeutic activities    PT Goals (Current goals can be found in the Care Plan section)  Acute Rehab PT Goals Patient Stated Goal: walk with least resistive AD PT Goal Formulation: With patient/family Time For Goal Achievement: 08/02/16 Potential to Achieve Goals: Good    Frequency Min 3X/week   Barriers to discharge        Co-evaluation               End of Session Equipment Utilized During Treatment: Gait belt Activity Tolerance: Patient tolerated treatment well Patient left: in chair;with call bell/phone within reach;with family/visitor present Nurse Communication: Mobility status PT Visit Diagnosis: Unsteadiness on feet (R26.81);History of falling (Z91.81);Difficulty in walking, not elsewhere classified (R26.2);Muscle weakness (generalized) (M62.81)    Time: 2025-4270 PT Time Calculation (min) (ACUTE ONLY): 29 min   Charges:   PT Evaluation $PT Eval Low Complexity: 1 Procedure PT Treatments $Gait Training: 8-22 mins   PT G Codes:   PT G-Codes **NOT FOR INPATIENT CLASS** Functional Assessment Tool Used: AM-PAC 6 Clicks Basic Mobility;Clinical judgement Functional Limitation: Mobility: Walking and moving around Mobility: Walking and Moving Around Current Status (W2376): At least 1 percent but less than 20 percent impaired, limited or restricted Mobility: Walking and Moving Around Goal Status [phone]): At least 1 percent but less than 20 percent impaired, limited or restricted    Philomena Doheny 07/19/2016, 12:47 PM [phone]

## 2016-07-19 NOTE — H&P (Signed)
History and Physical    Corey Marshall WUX:[id number] DOB: 1931-04-04 DOA: 07/18/2016  PCP: Reeves Dam, MD  Patient coming from: Home.  Chief Complaint: Weakness.  HPI: Corey Marshall is a 83 y.o. male with history of paroxysmal atrial fibrillation on metoprolol and aspirin, hypertension, BPH and history of throat cancer status post surgery and radiation was brought to the ER after patient was having persistent weakness and tachycardia. Patient states around 8 PM last night when patient was trying to stand up from the chair patient started feeling weak and had to sit back. Weakness continued for almost 2 hours. For a brief period patient had blurred vision lasted for around 4-5 minutes. Resolved without any intervention. When patient vitals were checked at home by family patient was found to be tachycardic in the 100-130 bpm and blood pressure also was elevated and the 140s to 150s admission blood pressure usually runs in 100s. Patient did not have any chest pain or shortness of breath nausea vomiting diarrhea. Denies any focal weakness of her extremities or any difficulty swallowing or speaking. Since symptoms persisted patient was brought to the ER.   As per patient's granddaughter patient has been running low normal blood pressure and his amlodipine was discontinued and metoprolol and hydrochlorothiazide doses were decreased 6 months ago.  Patient has history of A. fib and has not been taking Coumadin for last many years per patient's own request. Patient is only on aspirin.  ED Course: In the ER initially patient was tachycardic by the time EKG was done patient was in sinus rhythm. Labs revealed acute renal failure with creatinine about 1.7 and last creatinine in February 2018 was normal. Troponin was mildly elevated. CT head is unremarkable. Patient was appearing nonfocal. Patient received 500 mL normal saline bolus. On-call neurologist was consulted and patient is being admitted for  generalized weakness. On exam patient appears nonfocal. States he feels better now.  Review of Systems: As per HPI, rest all negative.   Past Medical History:  Diagnosis Date  . Atrial fibrillation ([place])   . Hypertension   . Peripheral neuralgia   . Trigger finger     Past Surgical History:  Procedure Laterality Date  . APPENDECTOMY    . KNEE SURGERY    . throat cancer surgery       reports that he has quit smoking. He has never used smokeless tobacco. He reports that he does not drink alcohol. His drug history is not on file.  Allergies  Allergen Reactions  . Darvon [Propoxyphene]   . Demerol [Meperidine]     Family History  Problem Relation Age of Onset  . CAD Neg Hx   . Diabetes Mellitus II Neg Hx     Prior to Admission medications   Medication Sig Start Date End Date Taking? Authorizing Provider  AMLODIPINE BESYLATE-VALSARTAN PO Take 5 mg by mouth daily.    Historical Provider, MD  aspirin 325 MG tablet Take 325 mg by mouth daily.    Historical Provider, MD  cetirizine-pseudoephedrine (ZYRTEC-D) 5-120 MG per tablet Take 1 tablet by mouth 2 (two) times daily.    Historical Provider, MD  cyanocobalamin 1000 MCG tablet Take 100 mcg by mouth daily.    Historical Provider, MD  folic acid (FOLVITE) 253 MCG tablet Take 400 mcg by mouth daily.    Historical Provider, MD  hydrochlorothiazide (HYDRODIURIL) 25 MG tablet Take 12.5 mg by mouth daily.     Historical Provider, MD  levothyroxine (SYNTHROID, LEVOTHROID) 50  MCG tablet Take 37.5 mcg by mouth daily before breakfast.     Historical Provider, MD  meclizine (ANTIVERT) 25 MG tablet Take 25 mg by mouth 3 (three) times daily as needed for dizziness.    Historical Provider, MD  meloxicam (MOBIC) 15 MG tablet Take 15 mg by mouth daily.    Historical Provider, MD  metoprolol (LOPRESSOR) 100 MG tablet Take 12.5 mg by mouth 2 (two) times daily.     Historical Provider, MD  Multiple Vitamins-Minerals (MULTIVITAMIN WITH MINERALS)  tablet Take 1 tablet by mouth daily.    Historical Provider, MD  ranitidine (ZANTAC) 150 MG tablet Take 150 mg by mouth 2 (two) times daily.    Historical Provider, MD  traMADol (ULTRAM) 50 MG tablet Take 50 mg by mouth every 6 (six) hours as needed.    Historical Provider, MD    Physical Exam: Vitals:   07/19/16 0124 07/19/16 0200 07/19/16 0302 07/19/16 0500  BP: 134/80 (!) 175/87 (!) 164/89 (!) 152/82  Pulse: 73 62 66 60  Resp:  14 16 18   Temp:   97.7 F (36.5 C) 97.6 F (36.4 C)  TempSrc:   Oral Oral  SpO2:  100% 97% 96%  Weight:   95.7 kg (211 lb)   Height:   5\' 11"  (1.803 m)       Constitutional: Moderately built and nourished. Vitals:   07/19/16 0124 07/19/16 0200 07/19/16 0302 07/19/16 0500  BP: 134/80 (!) 175/87 (!) 164/89 (!) 152/82  Pulse: 73 62 66 60  Resp:  14 16 18   Temp:   97.7 F (36.5 C) 97.6 F (36.4 C)  TempSrc:   Oral Oral  SpO2:  100% 97% 96%  Weight:   95.7 kg (211 lb)   Height:   5\' 11"  (1.803 m)    Eyes: Anicteric. No pallor. ENMT: No discharge from the ears eyes nose and mouth. Neck: No mass felt. No neck rigidity. Respiratory: No rhonchi or crepitations. Cardiovascular: S1-S2 heard no murmurs appreciated. Abdomen: Soft nontender bowel sounds present. Musculoskeletal: No edema. No joint effusion. Skin: No rash. Skin appears warm. Neurologic: Alert awake oriented to time place and person. Moves all extremities 5 x 5. No facial asymmetry tongue is midline. Pupils are equal and reacting to light. Psychiatric: Appears normal. Normal affect.   Labs on Admission: I have personally reviewed following labs and imaging studies  CBC:  Recent Labs Lab 07/18/16 2331  WBC 6.7  NEUTROABS 4.3  HGB 12.7*  HCT 37.4*  MCV 95.9  PLT 315   Basic Metabolic Panel:  Recent Labs Lab 07/18/16 2331  NA 139  K 3.5  CL 105  CO2 28  GLUCOSE 111*  BUN 32*  CREATININE 1.74*  CALCIUM 8.9   GFR: Estimated Creatinine Clearance: 38 mL/min (A) (by C-G  formula based on SCr of 1.74 mg/dL (H)). Liver Function Tests: No results for input(s): AST, ALT, ALKPHOS, BILITOT, PROT, ALBUMIN in the last 168 hours. No results for input(s): LIPASE, AMYLASE in the last 168 hours. No results for input(s): AMMONIA in the last 168 hours. Coagulation Profile: No results for input(s): INR, PROTIME in the last 168 hours. Cardiac Enzymes:  Recent Labs Lab 07/18/16 2331  TROPONINI 0.03*   BNP (last 3 results) No results for input(s): PROBNP in the last 8760 hours. HbA1C: No results for input(s): HGBA1C in the last 72 hours. CBG: No results for input(s): GLUCAP in the last 168 hours. Lipid Profile: No results for input(s): CHOL, HDL, LDLCALC, TRIG,  CHOLHDL, LDLDIRECT in the last 72 hours. Thyroid Function Tests: No results for input(s): TSH, T4TOTAL, FREET4, T3FREE, THYROIDAB in the last 72 hours. Anemia Panel: No results for input(s): VITAMINB12, FOLATE, FERRITIN, TIBC, IRON, RETICCTPCT in the last 72 hours. Urine analysis:    Component Value Date/Time   COLORURINE YELLOW 07/19/2016 0017   APPEARANCEUR CLEAR 07/19/2016 0017   LABSPEC 1.018 07/19/2016 0017   PHURINE 6.0 07/19/2016 0017   GLUCOSEU NEGATIVE 07/19/2016 0017   HGBUR NEGATIVE 07/19/2016 0017   BILIRUBINUR NEGATIVE 07/19/2016 0017   KETONESUR NEGATIVE 07/19/2016 0017   PROTEINUR NEGATIVE 07/19/2016 0017   UROBILINOGEN 1.0 04/12/2013 0957   NITRITE NEGATIVE 07/19/2016 0017   LEUKOCYTESUR NEGATIVE 07/19/2016 0017   Sepsis Labs: @LABRCNTIP (procalcitonin:4,lacticidven:4) )No results found for this or any previous visit (from the past 240 hour(s)).   Radiological Exams on Admission: Dg Chest 2 View  Result Date: 07/18/2016 CLINICAL DATA:  Weakness and unsteady gait. History of throat cancer. EXAM: CHEST  2 VIEW COMPARISON:  None. FINDINGS: The heart size and mediastinal contours are within normal limits. Atherosclerosis of the aortic arch without aneurysm. Both lungs are clear.  Degenerative changes are seen along the dorsal spine. No acute nor suspicious osseous abnormality. IMPRESSION: No active cardiopulmonary disease.  Aortic atherosclerosis Electronically Signed   By: Ashley Royalty M.D.   On: 07/18/2016 23:45   Ct Head Wo Contrast  Result Date: 07/18/2016 CLINICAL DATA:  Weakness and unsteady gait. History of throat cancer. EXAM: CT HEAD WITHOUT CONTRAST TECHNIQUE: Contiguous axial images were obtained from the base of the skull through the vertex without intravenous contrast. COMPARISON:  None. FINDINGS: Brain: Chronic appearing small vessel ischemic disease of periventricular white matter. Mild superficial and central atrophy. No intra-axial mass, hemorrhage nor extra-axial fluid collections. No hydrocephalus. Fourth ventricle is midline. No effacement of basal cisterns. Vascular: No hyperdense vessel or unexpected calcification. Mild atherosclerosis of the carotid siphons. Skull: Normal. Negative for fracture or focal lesion. Sinuses/Orbits: No acute finding. Other: None. IMPRESSION: Chronic appearing small vessel ischemic disease of periventricular white matter. No acute intracranial abnormality is identified. Electronically Signed   By: Ashley Royalty M.D.   On: 07/18/2016 23:47    EKG: Independently reviewed. Normal sinus rhythm with nonspecific ST changes in the interior leads.  Assessment/Plan Principal Problem:   Weakness generalized Active Problems:   ARF (acute renal failure) (HCC)   PAF (paroxysmal atrial fibrillation) (HCC)   Essential hypertension   Normocytic anemia   History of throat cancer    1. Generalized weakness - in the setting of possible A. fib with RVR. Discussed with neurologist Dr. Leonel Ramsay. Neurologist recommended MRI brain and if negative no further stroke workup. Patient weakness may be from transient hypotension if negative for stroke. Check orthostatics. Will obtain physical therapy consult. Monitor in telemetry. 2. A. fib with  possible transient rapid ventricular rate - has improved at this time. During the initial episode at home patient was given night dose of metoprolol by patient's family. Patient not on anticoagulation per patient's request. On metoprolol check 2-D echo continue aspirin. 3. Elevated troponin may be related to elevated heart rate - cycle cardiac markers check 2-D echo. Patient is on aspirin. 4. Acute renal failure - creatinine has increased from patient's normal metabolic panel in February 2018 seen at care everywhere. Will hold meloxicam and hydrochlorothiazide and gently hydrate follow metabolic panel. 5. History of BPH on finasteride and tamsulosin. 6. History of throat cancer status post surgery and radiation.   DVT  prophylaxis: Lovenox. Code Status: Full code.  Family Communication: Patient's granddaughter.  Disposition Plan: Home.  Consults called: Discussed with neurologist.  Admission status: Observation.    Rise Patience MD [facility] Pager [phone].  If 7PM-7AM, please contact night-coverage [email or web address] Password [facility]  07/19/2016, 5:26 AM

## 2016-07-19 NOTE — Progress Notes (Addendum)
I agree with note as per Dr. Hal Hope  85 that are and P A. fib [place] score >4 on aspirin at patient's request-declined use of Coumadin BPH Hyperlipidemia Hypertension Left-sided carpal tunnel syndrome Throat cancer surgery status post XRT Prior urinary retention and history of static hypertrophy on Flomax and other meds  Admitted with global weakness from the emergency room subacute 6-12 hours prior to admission no focal signs-had some blurred vision and also found to be tachycardic. Was in living room, got up and walked, felt dizzy and weak  Felt like legs buckles and "something came over hi"  Similar [place].  Fell-no recollection  Has been worked up for gait anomaly apparently Dr. Felecia Shelling in the past.  On admission AK I creatinine 1.7 From normal 05/2016 troponin mildly elevated CT head negative patient received 500 cc normal saline bolus. Point-of-care troponin 0.03  Creatinine is gone from 1.7-1.3 MRI is pending B12 is 630   Need orthostatics Hold HCTZ for foreseeable future-Labs am EKG wnl PT to eval and Rx Dizzyness somewhat difficult to discern-need to get OP work-up for spine--has ahad multple knee surgeries making diagnosis of neuropathy multifactorial per se   30 min additional time in discussion with family as well as with patient and granddaughter who is an NP  Verneita Griffes, MD [facility] [phone]

## 2016-07-19 NOTE — Care Management Note (Signed)
Case Management Note  Patient Details  Name: Melvern Ramone MRN: [id number] Date of Birth: 06-29-1930  Subjective/Objective:   Pt in with generalized weakness. He is from home.                  Action/Plan: PT recommending [place] services. Awaiting OT recs. CM following for d/c needs, physician orders.   Expected Discharge Date:                  Expected Discharge Plan:  [place]  In-House Referral:     Discharge planning Services     Post Acute Care Choice:    Choice offered to:     DME Arranged:    DME Agency:     HH Arranged:    [place] Agency:     Status of Service:  In process, will continue to follow  If discussed at Long Length of Stay Meetings, dates discussed:    Additional Comments:  Pollie Friar, RN 07/19/2016, 2:27 PM

## 2016-07-19 NOTE — Progress Notes (Signed)
Received patient from transport, assessment completed, VS documented, placed on teley box 17, oriented patient to the room.  Will continue to monitor.

## 2016-07-19 NOTE — Care Management Obs Status (Signed)
[facility] NOTIFICATION   Patient Details  Name: Richmond Coldren MRN: [id number] Date of Birth: 24-Jul-1930   Medicare Observation Status Notification Given:  Yes    Pollie Friar, RN 07/19/2016, 3:19 PM

## 2016-07-20 ENCOUNTER — Other Ambulatory Visit (HOSPITAL_COMMUNITY): Payer: Medicare Other

## 2016-07-20 ENCOUNTER — Observation Stay (HOSPITAL_BASED_OUTPATIENT_CLINIC_OR_DEPARTMENT_OTHER): Payer: Medicare Other

## 2016-07-20 DIAGNOSIS — R55 Syncope and collapse: Secondary | ICD-10-CM | POA: Diagnosis not present

## 2016-07-20 DIAGNOSIS — R531 Weakness: Secondary | ICD-10-CM | POA: Diagnosis not present

## 2016-07-20 LAB — ECHOCARDIOGRAM COMPLETE
Height: 71 in
Weight: 3376 oz

## 2016-07-20 LAB — COMPREHENSIVE METABOLIC PANEL
ALBUMIN: 3.1 g/dL — AB (ref 3.5–5.0)
ALK PHOS: 41 U/L (ref 38–126)
ALT: 23 U/L (ref 17–63)
AST: 31 U/L (ref 15–41)
Anion gap: 9 (ref 5–15)
BUN: 17 mg/dL (ref 6–20)
CALCIUM: 9.1 mg/dL (ref 8.9–10.3)
CO2: 29 mmol/L (ref 22–32)
Chloride: 104 mmol/L (ref 101–111)
Creatinine, Ser: 1.15 mg/dL (ref 0.61–1.24)
GFR calc Af Amer: >60 mL/min (ref >60)
GFR calc non Af Amer: 57 mL/min — ABNORMAL LOW (ref >60)
GLUCOSE: 95 mg/dL (ref 65–99)
Potassium: 3.7 mmol/L (ref 3.5–5.1)
SODIUM: 142 mmol/L (ref 135–145)
Total Bilirubin: 1.7 mg/dL — ABNORMAL HIGH (ref 0.3–1.2)
Total Protein: 5.4 g/dL — ABNORMAL LOW (ref 6.5–8.1)

## 2016-07-20 LAB — GLUCOSE, CAPILLARY: Glucose-Capillary: 103 mg/dL — ABNORMAL HIGH (ref 65–99)

## 2016-07-20 MED ORDER — RIVAROXABAN (XARELTO) EDUCATION KIT FOR AFIB PATIENTS
PACK | Freq: Once | Status: DC
  Filled 2016-07-20 (×2): qty 1

## 2016-07-20 MED ORDER — RIVAROXABAN (XARELTO) EDUCATION KIT FOR AFIB PATIENTS: 1.0000 | PACK | Freq: Once | 0 refills | Status: AC

## 2016-07-20 NOTE — Progress Notes (Signed)
Pt dc home, no new concerns, vital signs stable, wife will transport pt from the building. D/C instructions given, pt verbalize understanding.

## 2016-07-20 NOTE — Progress Notes (Signed)
  Echocardiogram 2D Echocardiogram has been performed.  Jennette Dubin 07/20/2016, 12:00 PM

## 2016-07-20 NOTE — Discharge Summary (Signed)
Physician Discharge Summary  Ballard Budney SAY:[id number] DOB: [birth year] DOA: 07/18/2016  PCP: Rich Fuchs, PA  Admit date: 07/18/2016 Discharge date: 07/20/2016  Time spent: 35 minutes  Recommendations for Outpatient Follow-up:  1. continue Xarelo for 2/2 preventions CVA from Afib 2. Needs labs cbc and cmet 1 mo 3. Event monitor being done prior to d/c-appreciate cardiology input 4.   Discharge Diagnoses:  Principal Problem:   Weakness generalized Active Problems:   ARF (acute renal failure) (HCC)   PAF (paroxysmal atrial fibrillation) (HCC)   Essential hypertension   Normocytic anemia   History of throat cancer   Discharge Condition: hh  Diet recommendation:  soft  Filed Weights   07/18/16 2250 07/19/16 0302  Weight: 95.3 kg (210 lb) 95.7 kg (211 lb)    History of present illness:  51 that are and P A. fib [place] score >4 on aspirin at patient's request-declined use of Coumadin BPH Hyperlipidemia Hypertension Left-sided carpal tunnel syndrome Throat cancer surgery status post XRT Prior urinary retention and history of static hypertrophy on Flomax and other meds  Admitted with global weakness from the emergency room subacute 6-12 hours prior to admission no focal signs-had some blurred vision and also found to be tachycardic. Was in living room, got up and walked, felt dizzy and weak  Felt like legs buckles and "something came over hi"  Similar [place].  Fell-no recollection  Has been worked up for gait anomaly apparently  Neurology Dr. Felecia Shelling in the past   Syncopy             Work-up MRI neg/Echo no severe AS. Orthostaticsneg A . Also has issues with prior dizzyness If no new findings could d/c home with PT/OT and HH therapy-- Will need OP work-up withDr. Felecia Shelling for potentital vascular claudication as well--could have also been 2/2 to volume depletion and would d/c HCTZ/mobic for the forseeable future.  Will need Xarelto education  and dosing as OP.  WIll also require OP follow up for BP and will need event monitor-Dr. Doylene Canard has agreed to place the same and follow up the patient   Procedures:  ECHO - Left ventricle: The cavity size was normal. Wall thickness was   normal. Systolic function was normal. The estimated ejection   fraction was in the range of 55% to 60%. Wall motion was normal;   there were no regional wall motion abnormalities. Left   ventricular diastolic function parameters were normal. - Aortic valve: Severe focal calcification involving the   noncoronary cusp. - Mitral valve: Calcified annulus.   Discharge Exam: Vitals:   07/20/16 0939 07/20/16 1332  BP: 116/73 128/73  Pulse: 66 (!) 54  Resp: 18 18  Temp: 98.4 F (36.9 C) 98.2 F (36.8 C)    General: alert pleasant in nad, no m Cardiovascular: s1 s2 no m/r/g, bno bruit, no jvd Respiratory: s1 s 2no m/r/g Abd soft, no rebound  Discharge Instructions   Discharge Instructions    Diet - low sodium heart healthy    Complete by:  As directed    Discharge instructions    Complete by:  As directed    I recommend you use Xarelto for stroke prevention.   Get some labs in a week at primary MD.  Careful with ambulation. Will get you some home health and do not take mobic or hctz Please see your primary MD-the only other thing to consider as an OP is maybe an event monitor to pick  up some slow heart rates and  We will give you info as to how to coordinate this I would recommend no independent driving for 6 months or until seen and cleared by your MD--this is a law in [place] for someone who experiences fainting spells   Increase activity slowly    Complete by:  As directed      Current Discharge Medication List    START taking these medications   Details  rivaroxaban (XARELTO) KIT 1 kit by Does not apply route once. Qty: 1 kit, Refills: 0      CONTINUE these medications which have NOT CHANGED   Details  cyanocobalamin 1000 MCG tablet  Take 1,000 mcg by mouth daily.     finasteride (PROSCAR) 5 MG tablet Take 5 mg by mouth daily.    folic acid (FOLVITE) 277 MCG tablet Take 400 mcg by mouth daily.    levothyroxine (SYNTHROID, LEVOTHROID) 25 MCG tablet Take 37.5 mcg by mouth daily before breakfast.     meclizine (ANTIVERT) 25 MG tablet Take 25 mg by mouth 3 (three) times daily as needed for dizziness.    metoprolol tartrate (LOPRESSOR) 25 MG tablet Take 12.5 mg by mouth 2 (two) times daily.     Multiple Vitamins-Minerals (MULTIVITAMIN WITH MINERALS) tablet Take 1 tablet by mouth at bedtime.     ranitidine (ZANTAC) 150 MG tablet Take 150 mg by mouth daily.     tamsulosin (FLOMAX) 0.4 MG CAPS capsule Take 0.4 mg by mouth at bedtime.      STOP taking these medications     aspirin 325 MG tablet      hydrochlorothiazide (HYDRODIURIL) 25 MG tablet      meloxicam (MOBIC) 15 MG tablet        Allergies  Allergen Reactions  . Darvon [Propoxyphene] Other (See Comments)    Unknown  . Demerol [Meperidine] Other (See Comments)    Unknown   Follow-up Information    [facility] Follow up.   Why:  They will call in 1-2 days after DC to set up your first [facility] appointmnet Contact information: [address] [place] [address] [phone]        [facility] - [facility] Follow up.   Why:  Rolling walker to be delivered to room prior to DC. Contact information: [address] [place] [address] [phone]            The results of significant diagnostics from this hospitalization (including imaging, microbiology, ancillary and laboratory) are listed below for reference.    Significant Diagnostic Studies: Dg Chest 2 View  Result Date: 07/18/2016 CLINICAL DATA:  Weakness and unsteady gait. History of throat cancer. EXAM: CHEST  2 VIEW COMPARISON:  None. FINDINGS: The heart size and mediastinal contours are within normal limits. Atherosclerosis of the aortic arch  without aneurysm. Both lungs are clear. Degenerative changes are seen along the dorsal spine. No acute nor suspicious osseous abnormality. IMPRESSION: No active cardiopulmonary disease.  Aortic atherosclerosis Electronically Signed   By: Ashley Royalty M.D.   On: 07/18/2016 23:45   Ct Head Wo Contrast  Result Date: 07/18/2016 CLINICAL DATA:  Weakness and unsteady gait. History of throat cancer. EXAM: CT HEAD WITHOUT CONTRAST TECHNIQUE: Contiguous axial images were obtained from the base of the skull through the vertex without intravenous contrast. COMPARISON:  None. FINDINGS: Brain: Chronic appearing small vessel ischemic disease of periventricular white matter. Mild superficial and central atrophy. No intra-axial mass, hemorrhage nor  extra-axial fluid collections. No hydrocephalus. Fourth ventricle is midline. No effacement of basal cisterns. Vascular: No hyperdense vessel or unexpected calcification. Mild atherosclerosis of the carotid siphons. Skull: Normal. Negative for fracture or focal lesion. Sinuses/Orbits: No acute finding. Other: None. IMPRESSION: Chronic appearing small vessel ischemic disease of periventricular white matter. No acute intracranial abnormality is identified. Electronically Signed   By: Ashley Royalty M.D.   On: 07/18/2016 23:47   Mr Brain Wo Contrast  Result Date: 07/19/2016 CLINICAL DATA:  84 y/o M; persistent weakness and tachycardia. History of throat cancer post surgery and radiation. EXAM: MRI HEAD WITHOUT CONTRAST TECHNIQUE: Multiplanar, multiecho pulse sequences of the brain and surrounding structures were obtained without intravenous contrast. COMPARISON:  07/18/2016 CT of the head. FINDINGS: Brain: No acute infarction, hemorrhage, hydrocephalus, extra-axial collection or mass lesion. Nonspecific foci of T2 FLAIR hyperintense signal abnormality in subcortical and periventricular white matter are compatible mild chronic microvascular ischemic changes for age. Mild brain  parenchymal volume loss. Vascular: Normal flow voids. Skull and upper cervical spine: Normal marrow signal. Sinuses/Orbits: Mild ethmoid sinus mucosal thickening and left maxillary sinus mucous retention cyst. No abnormal signal of mastoid air cells. Other: None. IMPRESSION: 1. No acute intracranial abnormality. 2. Mild chronic microvascular ischemic changes and mild parenchymal volume loss of the brain. 3. Mild paranasal sinus disease. Electronically Signed   By: Kristine Garbe M.D.   On: 07/19/2016 20:36    Microbiology: No results found for this or any previous visit (from the past 240 hour(s)).   Labs: Basic Metabolic Panel:  Recent Labs Lab 07/18/16 2331 07/19/16 0615 07/20/16 0553  NA 139 140 142  K 3.5 3.3* 3.7  CL 105 106 104  CO2 _0 GLUCOSE 111* 109* 95  BUN 32* 25* 17  CREATININE 1.74* 1.39* 1.15  CALCIUM 8.9 8.7* 9.1   Liver Function Tests:  Recent Labs Lab 07/19/16 0615 07/20/16 0553  AST 39 31  ALT 25 23  ALKPHOS 41 41  BILITOT 1.2 1.7*  PROT 5.2* 5.4*  ALBUMIN 3.3* 3.1*   No results for input(s): LIPASE, AMYLASE in the last 168 hours. No results for input(s): AMMONIA in the last 168 hours. CBC:  Recent Labs Lab 07/18/16 2331 07/19/16 0615  WBC 6.7 4.8  NEUTROABS 4.3 2.8  HGB 12.7* 11.5*  HCT 37.4* 33.7*  MCV 95.9 94.4  PLT 171 162   Cardiac Enzymes:  Recent Labs Lab 07/18/16 2331 07/19/16 0615 07/19/16 1132 07/19/16 1801  CKTOTAL  --  513*  --   --   TROPONINI 0.03* 0.03* 0.03* 0.03*   BNP: BNP (last 3 results) No results for input(s): BNP in the last 8760 hours.  ProBNP (last 3 results) No results for input(s): PROBNP in the last 8760 hours.  CBG:  Recent Labs Lab 07/20/16 1151  GLUCAP 103*       Signed:  Nita Sells MD   [facility] 07/20/2016, 2:51 PM

## 2016-07-20 NOTE — Progress Notes (Signed)
Patient expressed concern about being in hospital for over 24 hours and unable to have his ECHO done, only test ordered which is remaining in his stroke work up which to this point has been negative. Will ask day shift nurse to call [facility] this morning to request them to expedite procedure.

## 2016-07-20 NOTE — Consult Note (Addendum)
Referring Physician: Dr. Dyke Brackett, Lauren, PA  Corey Marshall is an 83 y.o. male.                       Chief Complaint: Palpitation and weakness  HPI: 83 year old male with known history of paroxysmal atrial fibrillation for 20 years had episode of palpitation and weakness for 1 day prior to arrival. He is back in sinus rhythm on his own in few minutes in ER. His CT head and MR brain for negative for acute stroke and positive for chronic microvascular changes and mild atrophy. Echocardiogram showed mild to moderate MR, TR and dilated LA and RA with mild systolic dysfunction with moderate LVH. His TSH is slightly high and his troponin-I's are minimally elevated with mild renal dysfunction. UA was normal.  Past Medical History:  Diagnosis Date  . Atrial fibrillation ([place])   . Cancer (H[facility])    HS OF THROAT CANCER  . Complication of anesthesia   . Enlarged prostate   . Family history of adverse reaction to anesthesia    NAUSEA  . Hypertension   . Hypothyroidism   . Peripheral neuralgia   . PONV (postoperative nausea and vomiting)   . Trigger finger       Past Surgical History:  Procedure Laterality Date  . APPENDECTOMY    . CARPAL TUNNEL RELEASE Bilateral   . KNEE SURGERY    . throat cancer surgery      Family History  Problem Relation Age of Onset  . CAD Neg Hx   . Diabetes Mellitus II Neg Hx    Social History:  reports that he has quit smoking. He has never used smokeless tobacco. He reports that he does not drink alcohol or use drugs.  Allergies:  Allergies  Allergen Reactions  . Darvon [Propoxyphene] Other (See Comments)    Unknown  . Demerol [Meperidine] Other (See Comments)    Unknown    Medications Prior to Admission  Medication Sig Dispense Refill  . aspirin 325 MG tablet Take 325 mg by mouth daily.    . cyanocobalamin 1000 MCG tablet Take 1,000 mcg by mouth daily.     . finasteride (PROSCAR) 5 MG tablet Take 5 mg by mouth daily.    . folic  acid (FOLVITE) 673 MCG tablet Take 400 mcg by mouth daily.    . hydrochlorothiazide (HYDRODIURIL) 25 MG tablet Take 12.5 mg by mouth daily.     Marland Kitchen levothyroxine (SYNTHROID, LEVOTHROID) 25 MCG tablet Take 37.5 mcg by mouth daily before breakfast.     . meclizine (ANTIVERT) 25 MG tablet Take 25 mg by mouth 3 (three) times daily as needed for dizziness.    . meloxicam (MOBIC) 15 MG tablet Take 15 mg by mouth daily.    . metoprolol tartrate (LOPRESSOR) 25 MG tablet Take 12.5 mg by mouth 2 (two) times daily.     . Multiple Vitamins-Minerals (MULTIVITAMIN WITH MINERALS) tablet Take 1 tablet by mouth at bedtime.     . ranitidine (ZANTAC) 150 MG tablet Take 150 mg by mouth daily.     . tamsulosin (FLOMAX) 0.4 MG CAPS capsule Take 0.4 mg by mouth at bedtime.      Results for orders placed or performed during the hospital encounter of 07/18/16 (from the past 48 hour(s))  CBC with Differential/Platelet     Status: Abnormal   Collection Time: 07/18/16 11:31 PM  Result Value Ref Range   WBC 6.7 4.0 - 10.5 K/uL  RBC 3.90 (L) 4.22 - 5.81 MIL/uL   Hemoglobin 12.7 (L) 13.0 - 17.0 g/dL   HCT 37.4 (L) 39.0 - 52.0 %   MCV 95.9 78.0 - 100.0 fL   MCH 32.6 26.0 - 34.0 pg   MCHC 34.0 30.0 - 36.0 g/dL   RDW 13.0 11.5 - 15.5 %   Platelets 171 150 - 400 K/uL   Neutrophils Relative % 65 %   Neutro Abs 4.3 1.7 - 7.7 K/uL   Lymphocytes Relative 25 %   Lymphs Abs 1.7 0.7 - 4.0 K/uL   Monocytes Relative 8 %   Monocytes Absolute 0.6 0.1 - 1.0 K/uL   Eosinophils Relative 2 %   Eosinophils Absolute 0.2 0.0 - 0.7 K/uL   Basophils Relative 0 %   Basophils Absolute 0.0 0.0 - 0.1 K/uL  Basic metabolic panel     Status: Abnormal   Collection Time: 07/18/16 11:31 PM  Result Value Ref Range   Sodium 139 135 - 145 mmol/L   Potassium 3.5 3.5 - 5.1 mmol/L   Chloride 105 101 - 111 mmol/L   CO2 28 22 - 32 mmol/L   Glucose, Bld 111 (H) 65 - 99 mg/dL   BUN 32 (H) 6 - 20 mg/dL   Creatinine, Ser 1.74 (H) 0.61 - 1.24 mg/dL    Calcium 8.9 8.9 - 10.3 mg/dL   GFR calc non Af Amer 35 (L) >60 mL/min   GFR calc Af Amer 40 (L) >60 mL/min    Comment: (NOTE) The eGFR has been calculated using the CKD EPI equation. This calculation has not been validated in all clinical situations. eGFR's persistently <60 mL/min signify possible Chronic Kidney Disease.    Anion gap 6 5 - 15  Troponin I     Status: Abnormal   Collection Time: 07/18/16 11:31 PM  Result Value Ref Range   Troponin I 0.03 (HH) <0.03 ng/mL    Comment: CRITICAL RESULT CALLED TO, READ BACK BY AND VERIFIED WITH: COBLE,S AT 0013 ON [id number] BY CHERESNOWSKY,T   Urinalysis, Routine w reflex microscopic     Status: None   Collection Time: 07/19/16 12:17 AM  Result Value Ref Range   Color, Urine YELLOW YELLOW   APPearance CLEAR CLEAR   Specific Gravity, Urine 1.018 1.005 - 1.030   pH 6.0 5.0 - 8.0   Glucose, UA NEGATIVE NEGATIVE mg/dL   Hgb urine dipstick NEGATIVE NEGATIVE   Bilirubin Urine NEGATIVE NEGATIVE   Ketones, ur NEGATIVE NEGATIVE mg/dL   Protein, ur NEGATIVE NEGATIVE mg/dL   Nitrite NEGATIVE NEGATIVE   Leukocytes, UA NEGATIVE NEGATIVE    Comment: Microscopic not done on urines with negative protein, blood, leukocytes, nitrite, or glucose < 500 mg/dL.  Troponin I (q 6hr x 3)     Status: Abnormal   Collection Time: 07/19/16  6:15 AM  Result Value Ref Range   Troponin I 0.03 (HH) <0.03 ng/mL    Comment: CRITICAL RESULT CALLED TO, READ BACK BY AND VERIFIED WITH: MINDY KNICK,RN AT 8676 07/19/16 BY ZBEECH.   Comprehensive metabolic panel     Status: Abnormal   Collection Time: 07/19/16  6:15 AM  Result Value Ref Range   Sodium 140 135 - 145 mmol/L   Potassium 3.3 (L) 3.5 - 5.1 mmol/L   Chloride 106 101 - 111 mmol/L   CO2 27 22 - 32 mmol/L   Glucose, Bld 109 (H) 65 - 99 mg/dL   BUN 25 (H) 6 - 20 mg/dL   Creatinine, Ser 1.39 (  H) 0.61 - 1.24 mg/dL   Calcium 8.7 (L) 8.9 - 10.3 mg/dL   Total Protein 5.2 (L) 6.5 - 8.1 g/dL   Albumin 3.3 (L) 3.5 -  5.0 g/dL   AST 39 15 - 41 U/L   ALT 25 17 - 63 U/L   Alkaline Phosphatase 41 38 - 126 U/L   Total Bilirubin 1.2 0.3 - 1.2 mg/dL   GFR calc non Af Amer 45 (L) >60 mL/min   GFR calc Af Amer 52 (L) >60 mL/min    Comment: (NOTE) The eGFR has been calculated using the CKD EPI equation. This calculation has not been validated in all clinical situations. eGFR's persistently <60 mL/min signify possible Chronic Kidney Disease.    Anion gap 7 5 - 15  CBC with Differential/Platelet     Status: Abnormal   Collection Time: 07/19/16  6:15 AM  Result Value Ref Range   WBC 4.8 4.0 - 10.5 K/uL   RBC 3.57 (L) 4.22 - 5.81 MIL/uL   Hemoglobin 11.5 (L) 13.0 - 17.0 g/dL   HCT 33.7 (L) 39.0 - 52.0 %   MCV 94.4 78.0 - 100.0 fL   MCH 32.2 26.0 - 34.0 pg   MCHC 34.1 30.0 - 36.0 g/dL   RDW 13.7 11.5 - 15.5 %   Platelets 162 150 - 400 K/uL   Neutrophils Relative % 58 %   Neutro Abs 2.8 1.7 - 7.7 K/uL   Lymphocytes Relative 32 %   Lymphs Abs 1.5 0.7 - 4.0 K/uL   Monocytes Relative 7 %   Monocytes Absolute 0.3 0.1 - 1.0 K/uL   Eosinophils Relative 3 %   Eosinophils Absolute 0.1 0.0 - 0.7 K/uL   Basophils Relative 0 %   Basophils Absolute 0.0 0.0 - 0.1 K/uL  CK     Status: Abnormal   Collection Time: 07/19/16  6:15 AM  Result Value Ref Range   Total CK 513 (H) 49 - 397 U/L  TSH     Status: Abnormal   Collection Time: 07/19/16  6:15 AM  Result Value Ref Range   TSH 4.757 (H) 0.350 - 4.500 uIU/mL    Comment: Performed by a 3rd Generation assay with a functional sensitivity of <=0.01 uIU/mL.  Vitamin B12     Status: None   Collection Time: 07/19/16  6:15 AM  Result Value Ref Range   Vitamin B-12 630 180 - 914 pg/mL    Comment: (NOTE) This assay is not validated for testing neonatal or myeloproliferative syndrome specimens for Vitamin B12 levels.   Folate     Status: None   Collection Time: 07/19/16  6:15 AM  Result Value Ref Range   Folate 42.9 >5.9 ng/mL    Comment: RESULTS CONFIRMED BY  MANUAL DILUTION  Iron and TIBC     Status: None   Collection Time: 07/19/16  6:15 AM  Result Value Ref Range   Iron 53 45 - 182 ug/dL   TIBC 276 250 - 450 ug/dL   Saturation Ratios 19 17.9 - 39.5 %   UIBC 223 ug/dL  Ferritin     Status: None   Collection Time: 07/19/16  6:15 AM  Result Value Ref Range   Ferritin 64 24 - 336 ng/mL  Reticulocytes     Status: Abnormal   Collection Time: 07/19/16  6:15 AM  Result Value Ref Range   Retic Ct Pct 0.9 0.4 - 3.1 %   RBC. 3.57 (L) 4.22 - 5.81 MIL/uL   Retic Count,  Manual 32.1 19.0 - 186.0 K/uL  Troponin I (q 6hr x 3)     Status: Abnormal   Collection Time: 07/19/16 11:32 AM  Result Value Ref Range   Troponin I 0.03 (HH) <0.03 ng/mL    Comment: CRITICAL VALUE NOTED.  VALUE IS CONSISTENT WITH PREVIOUSLY REPORTED AND CALLED VALUE.  Troponin I (q 6hr x 3)     Status: Abnormal   Collection Time: 07/19/16  6:01 PM  Result Value Ref Range   Troponin I 0.03 (HH) <0.03 ng/mL    Comment: CRITICAL VALUE NOTED.  VALUE IS CONSISTENT WITH PREVIOUSLY REPORTED AND CALLED VALUE.  Comprehensive metabolic panel     Status: Abnormal   Collection Time: 07/20/16  5:53 AM  Result Value Ref Range   Sodium 142 135 - 145 mmol/L   Potassium 3.7 3.5 - 5.1 mmol/L   Chloride 104 101 - 111 mmol/L   CO2 29 22 - 32 mmol/L   Glucose, Bld 95 65 - 99 mg/dL   BUN 17 6 - 20 mg/dL   Creatinine, Ser 1.15 0.61 - 1.24 mg/dL   Calcium 9.1 8.9 - 10.3 mg/dL   Total Protein 5.4 (L) 6.5 - 8.1 g/dL   Albumin 3.1 (L) 3.5 - 5.0 g/dL   AST 31 15 - 41 U/L   ALT 23 17 - 63 U/L   Alkaline Phosphatase 41 38 - 126 U/L   Total Bilirubin 1.7 (H) 0.3 - 1.2 mg/dL   GFR calc non Af Amer 57 (L) >60 mL/min   GFR calc Af Amer >60 >60 mL/min    Comment: (NOTE) The eGFR has been calculated using the CKD EPI equation. This calculation has not been validated in all clinical situations. eGFR's persistently <60 mL/min signify possible Chronic Kidney Disease.    Anion gap 9 5 - 15  Glucose,  capillary     Status: Abnormal   Collection Time: 07/20/16 11:51 AM  Result Value Ref Range   Glucose-Capillary 103 (H) 65 - 99 mg/dL   Dg Chest 2 View  Result Date: 07/18/2016 CLINICAL DATA:  Weakness and unsteady gait. History of throat cancer. EXAM: CHEST  2 VIEW COMPARISON:  None. FINDINGS: The heart size and mediastinal contours are within normal limits. Atherosclerosis of the aortic arch without aneurysm. Both lungs are clear. Degenerative changes are seen along the dorsal spine. No acute nor suspicious osseous abnormality. IMPRESSION: No active cardiopulmonary disease.  Aortic atherosclerosis Electronically Signed   By: Ashley Royalty M.D.   On: 07/18/2016 23:45   Ct Head Wo Contrast  Result Date: 07/18/2016 CLINICAL DATA:  Weakness and unsteady gait. History of throat cancer. EXAM: CT HEAD WITHOUT CONTRAST TECHNIQUE: Contiguous axial images were obtained from the base of the skull through the vertex without intravenous contrast. COMPARISON:  None. FINDINGS: Brain: Chronic appearing small vessel ischemic disease of periventricular white matter. Mild superficial and central atrophy. No intra-axial mass, hemorrhage nor extra-axial fluid collections. No hydrocephalus. Fourth ventricle is midline. No effacement of basal cisterns. Vascular: No hyperdense vessel or unexpected calcification. Mild atherosclerosis of the carotid siphons. Skull: Normal. Negative for fracture or focal lesion. Sinuses/Orbits: No acute finding. Other: None. IMPRESSION: Chronic appearing small vessel ischemic disease of periventricular white matter. No acute intracranial abnormality is identified. Electronically Signed   By: Ashley Royalty M.D.   On: 07/18/2016 23:47   Mr Brain Wo Contrast  Result Date: 07/19/2016 CLINICAL DATA:  83 y/o M; persistent weakness and tachycardia. History of throat cancer post surgery and radiation.  EXAM: MRI HEAD WITHOUT CONTRAST TECHNIQUE: Multiplanar, multiecho pulse sequences of the brain and  surrounding structures were obtained without intravenous contrast. COMPARISON:  07/18/2016 CT of the head. FINDINGS: Brain: No acute infarction, hemorrhage, hydrocephalus, extra-axial collection or mass lesion. Nonspecific foci of T2 FLAIR hyperintense signal abnormality in subcortical and periventricular white matter are compatible mild chronic microvascular ischemic changes for age. Mild brain parenchymal volume loss. Vascular: Normal flow voids. Skull and upper cervical spine: Normal marrow signal. Sinuses/Orbits: Mild ethmoid sinus mucosal thickening and left maxillary sinus mucous retention cyst. No abnormal signal of mastoid air cells. Other: None. IMPRESSION: 1. No acute intracranial abnormality. 2. Mild chronic microvascular ischemic changes and mild parenchymal volume loss of the brain. 3. Mild paranasal sinus disease. Electronically Signed   By: Kristine Garbe M.D.   On: 07/19/2016 20:36    Review Of Systems Constitutional: No fever, chills, weight loss or gain. Eyes: Positive vision change, wears glasses. No discharge or pain. Ears: Positive hearing loss, No tinnitus. Respiratory: No asthma, COPD, pneumonias. No shortness of breath. No hemoptysis. Cardiovascular: No chest pain, Positive palpitation, leg edema. Gastrointestinal: No nausea, vomiting, diarrhea, constipation. No GI bleed. No hepatitis. Genitourinary: No dysuria, hematuria, kidney stone. No incontinance. Neurological: No headache, stroke, seizures. Positive dizziness Psychiatry: No psych facility admission for anxiety, depression, suicide. No detox. Skin: No rash. Musculoskeletal: Positive joint pain, no fibromyalgia. No neck pain, back pain. Lymphadenopathy: No lymphadenopathy. Hematology: No anemia or easy bruising.   Blood pressure 128/73, pulse (!) 54, temperature 98.2 F (36.8 C), temperature source Oral, resp. rate 18, height _0  (1.803 m), weight 95.7 kg (211 lb), SpO2 95 %. Body mass index is 29.43  kg/m. General appearance: alert, cooperative, appears stated age and no distress Head: Normocephalic, atraumatic. Eyes: Hazel eyes, wears glasses, pink conjunctiva, corneas clear. PERRL, EOM's intact. Neck: No adenopathy, no carotid bruit, no JVD, supple, symmetrical, trachea midline and thyroid not enlarged. Resp: Clear to auscultation bilaterally. Cardio: Regular rate and rhythm, S1, S2 normal, II/VI systolic murmur, no click, rub or gallop GI: Soft, non-tender; bowel sounds normal; no organomegaly. Extremities: No edema, cyanosis or clubbing. Positive varicose veins. Skin: Warm and dry.  Neurologic: Alert and oriented X 3, normal strength. Normal coordination and slow gait. Romberg mildly positive.  Assessment/Plan Paroxysmal atrial fibrillation, CHA2DS2VASc score of 4 Hypertrophic cardiomyopathy without obstruction Weakness Dehydration from diuretic use Hypertension BPH S/P throat cancer Hypothyroidism  Agree with Metoprolol and Xarelto use. Starter sample of Xarelto from office given Patient agrees to use walker/Cane all the time and refrain from driving for now. Increase levo-thyroxin to 50 mcg. daily.-New Rx given. May drink extra fluids as tolerated.  Birdie Riddle, MD  07/20/2016, 4:59 PM

## 2016-07-20 NOTE — Care Management Note (Addendum)
Case Management Note  Patient Details  Name: Corey Marshall MRN: [id number] Date of Birth: 09-03-1930  Subjective/Objective:                 Spoke with family at the bedside, patient off unit for Echo. Patient from home with wife, who has dementia. Family anticipates DC to home after echo. They would like to use [facility] for [facility]. RW delivered to room. Anticipate anticoag to be started prior to DC. CM explained coupon use. [facility] for MD there to rewrite Rx for it to be filled through [place] at no cost. Referral made to [facility] for [facility], Patient provided with Xaralto 30 day card.    Action/Plan:  DC to home w [place].   Expected Discharge Date:                  Expected Discharge Plan:  [facility]  In-House Referral:     Discharge planning Services  CM Consult  Post Acute Care Choice:  Home Health Choice offered to:  Spouse, Adult Children  DME Arranged:    DME Agency:     HH Arranged:    [place] Agency:  [place]  Status of Service:  In process, will continue to follow  If discussed at Long Length of Stay Meetings, dates discussed:    Additional Comments:  Carles Collet, RN 07/20/2016, 11:33 AM

## 2016-07-20 NOTE — Progress Notes (Signed)
Corey Marshall with [facility] to exchange RW for rolator prior to DC.

## 2016-07-20 NOTE — Progress Notes (Signed)
Physical Therapy Treatment Patient Details Name: Corey Marshall MRN: [id number] DOB: [birth year] Today's Date: 07/20/2016    History of Present Illness 83 y.o. male with history of paroxysmal atrial fibrillation on metoprolol and aspirin, hypertension, BPH and history of throat cancer status post surgery and radiation was brought to the ER after patient was having persistent weakness and tachycardia. Patient states around 8 PM last night when patient was trying to stand up from the chair patient started feeling weak and had to sit back. Weakness continued for almost 2 hours. For a brief period patient had blurred vision lasted for around 4-5 minutes.  Dx of acute renal failure, a fib, elevated troponin. CT head negative.    PT Comments    Pt performed increased mobility during session and performed DGI and patient remains a high risk for falling without the use of his RW.  Pt will require use of this device until his balance improves.  Will continue to recommend HHPT to address balance deficits.     Follow Up Recommendations  Home health PT     Equipment Recommendations  Rolling walker with 5" wheels    Recommendations for Other Services       Precautions / Restrictions Precautions Precautions: Fall Precaution Comments: pt reports 2 falls in past year, family states there have been more than 2 Restrictions Weight Bearing Restrictions: No    Mobility  Bed Mobility Overal bed mobility: Independent                Transfers Overall transfer level: Independent Equipment used: None                Ambulation/Gait Ambulation/Gait assistance: Min guard;Min assist Ambulation Distance (Feet): 250 Feet Assistive device: None Gait Pattern/deviations: Step-through pattern;Decreased dorsiflexion - left   Gait velocity interpretation: Below normal speed for age/gender General Gait Details: Performed gait without RW to challenge balance.  Pt remains to require cues for L foot  clearance due to mild foot drop.  Pt able to maintain balance during gait training but demonstrates LOB with DGI tasks.     Stairs Stairs: Yes   Stair Management: Two rails Number of Stairs: 5 General stair comments: Cues for sequencing and foot clearance on L.    Wheelchair Mobility    Modified Rankin (Stroke Patients Only)       Balance Overall balance assessment: Needs assistance   Sitting balance-Leahy Scale: Normal       Standing balance-Leahy Scale: Fair Standing balance comment: When challenging balance LOB noted.                   Standardized Balance Assessment Standardized Balance Assessment : Dynamic Gait Index   Dynamic Gait Index Level Surface: Mild Impairment Change in Gait Speed: Mild Impairment Gait with Horizontal Head Turns: Moderate Impairment Gait with Vertical Head Turns: Moderate Impairment Gait and Pivot Turn: Moderate Impairment Step Over Obstacle: Moderate Impairment Step Around Obstacles: Moderate Impairment Steps: Mild Impairment Total Score: 11      Cognition Arousal/Alertness: Awake/alert Behavior During Therapy: WFL for tasks assessed/performed Overall Cognitive Status: Within Functional Limits for tasks assessed                                        Exercises General Exercises - Lower Extremity Hip ABduction/ADduction: AROM;Both;10 reps;Standing Hip Flexion/Marching: AROM;Both;10 reps;Standing Mini-Sqauts: AROM;Both;10 reps;Standing    General Comments  Pertinent Vitals/Pain Pain Assessment: No/denies pain    Home Living                      Prior Function            PT Goals (current goals can now be found in the care plan section) Acute Rehab PT Goals Patient Stated Goal: walk with least resistive AD Potential to Achieve Goals: Good Progress towards PT goals: Progressing toward goals    Frequency    Min 3X/week      PT Plan Current plan remains appropriate     Co-evaluation             End of Session Equipment Utilized During Treatment: Gait belt Activity Tolerance: Patient tolerated treatment well Patient left: in chair;with call bell/phone within reach;with family/visitor present   PT Visit Diagnosis: Unsteadiness on feet (R26.81);History of falling (Z91.81);Difficulty in walking, not elsewhere classified (R26.2);Muscle weakness (generalized) (M62.81)     Time: 8811-0315 PT Time Calculation (min) (ACUTE ONLY): 16 min  Charges:  $Gait Training: 8-22 mins                    G Codes:       Governor Rooks, PTA pager [place] 07/20/2016, 3:15 PM

## 2016-08-14 ENCOUNTER — Encounter: Payer: Self-pay | Admitting: Rehabilitative and Restorative Service Providers"

## 2016-08-14 ENCOUNTER — Ambulatory Visit (INDEPENDENT_AMBULATORY_CARE_PROVIDER_SITE_OTHER): Payer: Medicare Other | Admitting: Rehabilitative and Restorative Service Providers"

## 2016-08-14 DIAGNOSIS — R269 Unspecified abnormalities of gait and mobility: Secondary | ICD-10-CM | POA: Diagnosis not present

## 2016-08-14 DIAGNOSIS — M6281 Muscle weakness (generalized): Secondary | ICD-10-CM

## 2016-08-14 DIAGNOSIS — R2681 Unsteadiness on feet: Secondary | ICD-10-CM | POA: Diagnosis not present

## 2016-08-14 NOTE — Therapy (Signed)
[place], [place], [address] Phone: [phone]   Fax:  [phone]  Physical Therapy Evaluation  Patient Details  Name: Corey Marshall MRN: [id number] Date of Birth: 02/16/1931 Referring Provider: Rich Fuchs, PA  Encounter Date: 08/14/2016    Past Medical History:  Diagnosis Date  . Atrial fibrillation ([place])   . Cancer (HCC)    HS OF THROAT CANCER  . Complication of anesthesia   . Enlarged prostate   . Family history of adverse reaction to anesthesia    NAUSEA  . Hypertension   . Hypothyroidism   . Peripheral neuralgia   . PONV (postoperative nausea and vomiting)   . Trigger finger     Past Surgical History:  Procedure Laterality Date  . APPENDECTOMY    . CARPAL TUNNEL RELEASE Bilateral   . KNEE SURGERY    . throat cancer surgery      There were no vitals filed for this visit.       Subjective Assessment - 08/14/16 1022    Subjective Patient reports that he has increased difficulty with balance over the past few years. He has had several surgeries in knees and back. He has difficulty with turning and walking, especially with turning. He has been told there is some nerve damage in his feet and legs. He has most feeling of weakness in the Lt side. Hospitaized for A-fib 07/18/16 for 2 days with medication changes. Experiencing some "light headedness" from medication changes. he has started using a walker (rarely uses) and a cane(when outside).   Pertinent History Nine surgeries on knees with TKA 2002; lumbar surgery for discetomy~2014; Meniere's disease diagnosed in his 20's - no symptoms in years - unable to lie on back and look up or work with arms up; HTN;   Patient Stated Goals Gain strength in LE's help balance if possible - concerned with $40 co-pay each visit            [facility] PT Assessment - 08/14/16 0001      Assessment   Medical Diagnosis Balance disorder    Referring  Provider Rich Fuchs, PA   Onset Date/Surgical Date 07/08/16   Hand Dominance Right     Precautions   Precautions None     Balance Screen   Has the patient fallen in the past 6 months No   Has the patient had a decrease in activity level because of a fear of falling?  No   Is the patient reluctant to leave their home because of a fear of falling?  No     Home Environment   Additional Comments single level home - one step no rail      Prior Function   Level of Independence Independent   Vocation Retired   Customer service manager work    Leisure wood work in garage; household chores; cooking      AROM   Right/Left Hip --  tight end range hip ext bilat   Right/Left Knee --  limited end ranges bilat   Right/Left Ankle --  limited ankle DF bilat      Strength   Right/Left Hip --  hip strength assessed in sitting    Right Hip Flexion 4+/5   Right Hip Extension 4/5   Right Hip ABduction 4/5   Left Hip Flexion 4/5   Left Hip Extension 4-/5   Left Hip ABduction 4-/5   Right Knee Flexion 4+/5   Right Knee  Extension 5/5   Left Knee Flexion 4/5   Left Knee Extension 4+/5   Right Ankle Dorsiflexion 4/5   Left Ankle Dorsiflexion 4-/5     Flexibility   Hamstrings tight bilat with hip flexed knee extended in sitting      Ambulation/Gait   Gait Comments ambulates with single point cane - wide based gait limited weight shift; forward flexed posture      Berg Balance Test   Sit to Stand Able to stand  independently using hands   Standing Unsupported Able to stand safely 2 minutes   Sitting with Back Unsupported but Feet Supported on Floor or Stool Able to sit safely and securely 2 minutes   Stand to Sit Sits safely with minimal use of hands   Transfers Able to transfer safely, definite need of hands   Standing Unsupported with Eyes Closed Able to stand 10 seconds safely   Standing Ubsupported with Feet Together Able to place feet together independently and stand 1  minute safely   From Standing, Reach Forward with Outstretched Arm Can reach forward >12 cm safely (5")   From Standing Position, Pick up Object from Floor Able to pick up shoe safely and easily   From Standing Position, Turn to Look Behind Over each Shoulder Looks behind from both sides and weight shifts well   Turn 360 Degrees Able to turn 360 degrees safely but slowly   Standing Unsupported, Alternately Place Feet on Step/Stool Able to complete >2 steps/needs minimal assist   Standing Unsupported, One Foot in [employer] balance while stepping or standing   Standing on One Leg Unable to try or needs assist to prevent fall   Total Score 40   Berg comment: significant risk for falls                    [facility] Adult PT Treatment/Exercise - 08/14/16 0001      Neuro Re-ed    Neuro Re-ed Details  working on standing posture and alignment 2 min x 2 reps      Lumbar Exercises: Standing   Other Standing Lumbar Exercises standing with support at rail - toe tap fwd/side/back alternating legs x 10 each; step to tap to 12 inch step alternating LE's x 10 each; SLS 30 sec x 3 each LE with UE support                 PT Education - 08/14/16 1101    Education provided Yes   Education Details HEP    Person(s) Educated Patient   Methods Explanation;Demonstration;Tactile cues;Verbal cues;Handout   Comprehension Verbalized understanding;Returned demonstration;Verbal cues required;Tactile cues required             PT Long Term Goals - 08/14/16 1300      PT LONG TERM GOAL #1   Title Improve posture and alignment with patient to demonstrate upright posture 09/26/16   Time 6   Period Weeks   Status New     PT LONG TERM GOAL #2   Title Improve balance with patient to demonstrate SLS with minimal UE support x 30-60 sec 09/26/16   Time 6   Period Weeks   Status New     PT LONG TERM GOAL #3   Title Improve Berg Balance Score by 3-6 points thus indicating decreased risk for  falling 09/26/16   Time 6   Period Weeks   Status New     PT LONG TERM GOAL #4   Title  Increase LE strength to 4+/5 to 5/5 throughout 09/26/16   Time 6   Period Weeks   Status New     PT LONG TERM GOAL #5   Title Independent in HEP 09/26/16   Time 6   Period Weeks   Status New               Plan - 08/14/16 1245    Clinical Impression Statement guy presents for moderate complexity evaluation of decreased balance. He demonstrates poor posture and alignment; limited ROM and mobility; decresaed LE strength Lt > Rt; significant risk for falls assessed with Berg Balance Scale score 40. Corey Marshall has concerns about the cost of therapy and requests that he be able to work on current exercise for 2 weeks prior to return to PT. He will benefit form PT on a regular basis to address deficits and reach a more confident and safe functional level at home.    Rehab Potential Good   Clinical Impairments Affecting Rehab Potential bilat knee pain and LBP on an intermittent basis; multiple comorbitities    PT Frequency 1x / week   PT Duration 6 weeks   PT Treatment/Interventions Patient/family education;ADLs/Self Care Home Management;Balance training;Moist Heat;Electrical Stimulation;Cryotherapy;Therapeutic activities;Therapeutic exercise;Dry needling;Manual techniques;Neuromuscular re-education   PT Next Visit Plan review HEP; progress exercise with higher level balance activities including core and trunk movements - core stabilization - PRW as tolerated. Further assess balance as indicated. Patient concerned with cost of PT and would like to move to independent HEP ASAP.    Consulted and Agree with Plan of Care Patient      Patient will benefit from skilled therapeutic intervention in order to improve the following deficits and impairments:  Postural dysfunction, Improper body mechanics, Decreased strength, Decreased mobility, Abnormal gait, Decreased balance, Decreased activity tolerance  Visit  Diagnosis: Unsteadiness on feet - Plan: PT plan of care cert/re-cert  Abnormality of gait - Plan: PT plan of care cert/re-cert  Muscle weakness (generalized) - Plan: PT plan of care cert/re-cert     Problem List Patient Active Problem List   Diagnosis Date Noted  . Weakness generalized 07/19/2016  . ARF (acute renal failure) ([place]) 07/19/2016  . PAF (paroxysmal atrial fibrillation) ([place]) 07/19/2016  . Essential hypertension 07/19/2016  . Normocytic anemia 07/19/2016  . History of throat cancer 07/19/2016    Drisana Schweickert Nilda Simmer PT, MPH  08/14/2016, 1:13 PM  [facility] [place], [place], [address] Phone: [phone]   Fax:  [phone]  Name: Bucky Grigg MRN: [id number] Date of Birth: 05-Sep-1930

## 2016-08-14 NOTE — Patient Instructions (Signed)
Standing without support during each commercial through a 30 min TV program  1-2 times a day  Hold at counter top - tap toe forward to side and back  Switch feet and repeat with opposite foot 20 times each leg   Hold at counter tap toe up and down onto 12 inch ledge  20-30 each leg   Balance on one foot with hand hold assist as needed  30 -60 sec 3-5 on each leg

## 2016-08-28 ENCOUNTER — Encounter: Payer: Medicare Other | Admitting: Rehabilitative and Restorative Service Providers"

## 2016-08-29 ENCOUNTER — Encounter: Payer: Medicare Other | Admitting: Rehabilitative and Restorative Service Providers"

## 2016-08-30 ENCOUNTER — Encounter: Payer: Medicare Other | Admitting: Rehabilitative and Restorative Service Providers"

## 2016-08-31 ENCOUNTER — Encounter: Payer: Self-pay | Admitting: Rehabilitative and Restorative Service Providers"

## 2016-08-31 ENCOUNTER — Ambulatory Visit (INDEPENDENT_AMBULATORY_CARE_PROVIDER_SITE_OTHER): Payer: Medicare Other | Admitting: Rehabilitative and Restorative Service Providers"

## 2016-08-31 DIAGNOSIS — R269 Unspecified abnormalities of gait and mobility: Secondary | ICD-10-CM

## 2016-08-31 DIAGNOSIS — R2681 Unsteadiness on feet: Secondary | ICD-10-CM

## 2016-08-31 DIAGNOSIS — M6281 Muscle weakness (generalized): Secondary | ICD-10-CM | POA: Diagnosis not present

## 2016-08-31 NOTE — Therapy (Signed)
[place], [place], [address] Phone: [phone]   Fax:  [phone]  Physical Therapy Evaluation  Patient Details  Name: Corey Marshall MRN: [id number] Date of Birth: 19-Dec-1930 Referring Provider: Rich Fuchs, PA  Encounter Date: 08/14/2016      PT End of Session - 08/31/16 1306    Visit Number 1   Number of Visits 6   Date for PT Re-Evaluation 09/26/16   Activity Tolerance Patient tolerated treatment well      Past Medical History:  Diagnosis Date  . Atrial fibrillation ([place])   . Cancer (HCC)    HS OF THROAT CANCER  . Complication of anesthesia   . Enlarged prostate   . Family history of adverse reaction to anesthesia    NAUSEA  . Hypertension   . Hypothyroidism   . Peripheral neuralgia   . PONV (postoperative nausea and vomiting)   . Trigger finger     Past Surgical History:  Procedure Laterality Date  . APPENDECTOMY    . CARPAL TUNNEL RELEASE Bilateral   . KNEE SURGERY    . throat cancer surgery      There were no vitals filed for this visit.       Subjective Assessment - 08/31/16 1154    Subjective Workingon exercises at home - most difficult one is SLS - trouble balancing and has to use arms. Still feels weaker on the Lt    Currently in Pain? No/denies                               PT Education - 08/31/16 1214    Education provided (P)  Yes   Education Details (P)  HEP    Person(s) Educated (P)  Patient   Methods (P)  Explanation;Demonstration;Tactile cues;Verbal cues;Handout   Comprehension (P)  Verbalized understanding;Returned demonstration;Verbal cues required;Tactile cues required             PT Long Term Goals - 08/14/16 1300      PT LONG TERM GOAL #1   Title Improve posture and alignment with patient to demonstrate upright posture 09/26/16   Time 6   Period Weeks   Status New     PT LONG TERM GOAL #2   Title Improve  balance with patient to demonstrate SLS with minimal UE support x 30-60 sec 09/26/16   Time 6   Period Weeks   Status New     PT LONG TERM GOAL #3   Title Improve Berg Balance Score by 3-6 points thus indicating decreased risk for falling 09/26/16   Time 6   Period Weeks   Status New     PT LONG TERM GOAL #4   Title Increase LE strength to 4+/5 to 5/5 throughout 09/26/16   Time 6   Period Weeks   Status New     PT LONG TERM GOAL #5   Title Independent in HEP 09/26/16   Time 6   Period Weeks   Status New        Treatment consisted of strengthening and balance activities. See copy in chart.     Patient will benefit from skilled therapeutic intervention in order to improve the following deficits and impairments:  Postural dysfunction, Improper body mechanics, Decreased strength, Decreased mobility, Abnormal gait, Decreased balance, Decreased activity tolerance  Visit Diagnosis: Unsteadiness on feet - Plan: PT plan of care cert/re-cert  Abnormality of  gait - Plan: PT plan of care cert/re-cert  Muscle weakness (generalized) - Plan: PT plan of care cert/re-cert     Problem List Patient Active Problem List   Diagnosis Date Noted  . Weakness generalized 07/19/2016  . ARF (acute renal failure) ([place]) 07/19/2016  . PAF (paroxysmal atrial fibrillation) ([place]) 07/19/2016  . Essential hypertension 07/19/2016  . Normocytic anemia 07/19/2016  . History of throat cancer 07/19/2016    Celyn Nilda Simmer 08/31/2016, 1:08 PM  [facility] [place], [place], [address] Phone: [phone]   Fax:  [phone]  Name: Corey Marshall MRN: [id number] Date of Birth: 27-Apr-1930

## 2016-08-31 NOTE — Therapy (Addendum)
[place], [place], [address] Phone: [phone]   Fax:  [phone]  Physical Therapy Treatment  Patient Details  Name: Corey Marshall MRN: [id number] Date of Birth: 1931-01-09 Referring Provider: Rich Fuchs, PA - C  Encounter Date: 08/31/2016      PT End of Session - 08/31/16 1306    Visit Number 1   Number of Visits 6   Date for PT Re-Evaluation 09/26/16   Activity Tolerance Patient tolerated treatment well      Past Medical History:  Diagnosis Date  . Atrial fibrillation ([place])   . Cancer (HCC)    HS OF THROAT CANCER  . Complication of anesthesia   . Enlarged prostate   . Family history of adverse reaction to anesthesia    NAUSEA  . Hypertension   . Hypothyroidism   . Peripheral neuralgia   . PONV (postoperative nausea and vomiting)   . Trigger finger     Past Surgical History:  Procedure Laterality Date  . APPENDECTOMY    . CARPAL TUNNEL RELEASE Bilateral   . KNEE SURGERY    . throat cancer surgery      There were no vitals filed for this visit.      Subjective Assessment - 08/31/16 1154    Subjective Workingon exercises at home - most difficult one is SLS - trouble balancing and has to use arms. Still feels weaker on the Lt    Currently in Pain? No/denies            [facility] PT Assessment - 08/31/16 0001      Assessment   Medical Diagnosis Balance disorder    Referring Provider Rich Fuchs, PA - C   Onset Date/Surgical Date 07/08/16   Hand Dominance Right     Flexibility   Hamstrings improved hip and knee extension noted with patient in standing                      [facility] Adult PT Treatment/Exercise - 08/31/16 0001      Lumbar Exercises: Aerobic   Stationary Bike nustep L5 x 5 min      Lumbar Exercises: Standing   Row Strengthening;Both;20 reps   Theraband Level (Row) Level 3 (Green)   Row Limitations added staggered stance and to  progress with alternate step back    Shoulder Extension Strengthening;Both;20 reps   Theraband Level (Shoulder Extension) Level 2 (Red);Level 3 (Green)   Shoulder Extension Limitations added stagger stance and to progress with alternate step back    Other Standing Lumbar Exercises standing with support at rail - toe tap fwd/side/back alternating legs x 10 each; step to tap to 12 inch step alternating LE's x 10 each; SLS 30 sec x 3 each LE with UE support    Other Standing Lumbar Exercises standing for work at counter top - weight shift to reach up and to the side; wt shift reaching across to cabinet; side stsp with UE ab/add clapping hands; overhead reach; heel toe walking; side steps; backwards walking; shallow knee bends; back at wall for hip extension x 10-20 reps each exercise/activitry      Lumbar Exercises: Seated   Sit to Stand 10 reps  focus on slow control for stand to sit   Sit to Stand Limitations Lt LE back to increase work Lt LE                 PT Education -  08/31/16 1214    Education provided (P)  Yes   Education Details (P)  HEP    Person(s) Educated (P)  Patient   Methods (P)  Explanation;Demonstration;Tactile cues;Verbal cues;Handout   Comprehension (P)  Verbalized understanding;Returned demonstration;Verbal cues required;Tactile cues required             PT Long Term Goals - 08/31/16 1325      PT LONG TERM GOAL #1   Title Improve posture and alignment with patient to demonstrate upright posture 09/26/16   Time 6   Period Weeks   Status On-going     PT LONG TERM GOAL #2   Title Improve balance with patient to demonstrate SLS with minimal UE support x 30-60 sec 09/26/16   Time 6   Period Weeks   Status On-going     PT LONG TERM GOAL #3   Title Improve Berg Balance Score by 3-6 points thus indicating decreased risk for falling 09/26/16   Time 6   Period Weeks   Status On-going     PT LONG TERM GOAL #4   Title Increase LE strength to 4+/5 to 5/5  throughout 09/26/16   Time 6   Period Weeks   Status On-going     PT LONG TERM GOAL #5   Title Independent in HEP 09/26/16   Time 6   Period Weeks   Status On-going               Plan - 08/31/16 1323    Clinical Impression Statement Shown returns to PT for exercise review and progression of exercises for home. He continues to demonstrate poor balance with higher level standing andgait activities. He tolerated exercise well in the clinic and is compliant with HEP. Corey Marshall will work on current program with additions from today and return in two weeks for adjustment and progression of exercise.    Rehab Potential Good   Clinical Impairments Affecting Rehab Potential bilat knee pain and LBP on an intermittent basis; multiple comorbitities    PT Frequency 1x / week   PT Duration 6 weeks   PT Treatment/Interventions Patient/family education;ADLs/Self Care Home Management;Balance training;Moist Heat;Electrical Stimulation;Cryotherapy;Therapeutic activities;Therapeutic exercise;Dry needling;Manual techniques;Neuromuscular re-education   PT Next Visit Plan review HEP; progress exercise with higher level balance activities including core and trunk movements - core stabilization - PRW as tolerated. Further assess balance as indicated. Patient concerned with cost of PT and would like to move to independent HEP ASAP.    Consulted and Agree with Plan of Care Patient      Patient will benefit from skilled therapeutic intervention in order to improve the following deficits and impairments:  Postural dysfunction, Improper body mechanics, Decreased strength, Decreased mobility, Abnormal gait, Decreased balance, Decreased activity tolerance  Visit Diagnosis: Unsteadiness on feet  Abnormality of gait  Muscle weakness (generalized)     Problem List Patient Active Problem List   Diagnosis Date Noted  . Weakness generalized 07/19/2016  . ARF (acute renal failure) ([place]) 07/19/2016  . PAF  (paroxysmal atrial fibrillation) ([place]) 07/19/2016  . Essential hypertension 07/19/2016  . Normocytic anemia 07/19/2016  . History of throat cancer 07/19/2016    Celyn Nilda Simmer PT, MPH  08/31/2016, 1:26 PM  [facility] [place], [place], [address] Phone: [phone]   Fax:  [phone]  Name: Corey Marshall MRN: [id number] Date of Birth: [birth year]  PHYSICAL THERAPY DISCHARGE SUMMARY  Visits from Start of Care: 2  Current functional level  related to goals / functional outcomes: See last progress note for discharge status   Remaining deficits: Unknown    Education / Equipment: HEP  Plan: Patient agrees to discharge.  Patient goals were not met. Patient is being discharged due to not returning since the last visit.  ?????     Celyn P. Helene Kelp PT, MPH 06/03/17 2:04 PM

## 2016-08-31 NOTE — Patient Instructions (Signed)
  Low Row: Standing   Face anchor, feet shoulder width apart. Palms up, pull arms back, squeezing shoulder blades together. Repeat 10__ times per set. Do 2-3__ sets per session. Do 1-2__ sessions per week. Anchor Height: Waist Progress to one foot back one forward with band pull Then progress to step back with one foot as you pull the band back     Strengthening: Resisted Extension   Hold tubing in right hand, arm forward. Pull arm back, elbow straight. Repeat _10___ times per set. Do 2-3____ sets per session. Do 2-3____ sessions per day. See directions above   Forward    Facing step, place one leg on step, flexed at hip. Step up slowly, bringing hips in line with knee and shoulder. Bring other foot onto step. Reverse process to step back down. Repeat with other leg. Do __10__ repetitions, _2-3___ sets. Up with right down with left Up with left down with right  Functional Quadriceps: Sit to Stand    Sit on edge of chair, feet flat on floor. Stand upright, extending knees fully. Repeat _10___ times per set. Do ___1-2_ sets per session. Do __1-2__ sessions per day. The slower the better  Place left foot back behind right to make left work harder

## 2016-09-14 ENCOUNTER — Encounter: Payer: Medicare Other | Admitting: Rehabilitative and Restorative Service Providers"

## 2018-04-29 ENCOUNTER — Emergency Department (HOSPITAL_COMMUNITY): Payer: Medicare Other

## 2018-04-29 ENCOUNTER — Other Ambulatory Visit: Payer: Self-pay

## 2018-04-29 ENCOUNTER — Observation Stay (HOSPITAL_COMMUNITY)
Admission: EM | Admit: 2018-04-29 | Discharge: 2018-04-30 | Disposition: A | Discharge status: Home / Self Care | Payer: Medicare Other | Attending: Cardiovascular Disease | Admitting: Cardiovascular Disease

## 2018-04-29 ENCOUNTER — Encounter (HOSPITAL_COMMUNITY): Payer: Self-pay | Admitting: Emergency Medicine

## 2018-04-29 ENCOUNTER — Observation Stay (HOSPITAL_COMMUNITY): Payer: Medicare Other

## 2018-04-29 DIAGNOSIS — Z7901 Long term (current) use of anticoagulants: Secondary | ICD-10-CM | POA: Insufficient documentation

## 2018-04-29 DIAGNOSIS — I10 Essential (primary) hypertension: Secondary | ICD-10-CM | POA: Insufficient documentation

## 2018-04-29 DIAGNOSIS — I480 Paroxysmal atrial fibrillation: Principal | ICD-10-CM | POA: Insufficient documentation

## 2018-04-29 DIAGNOSIS — Z79899 Other long term (current) drug therapy: Secondary | ICD-10-CM | POA: Insufficient documentation

## 2018-04-29 DIAGNOSIS — Z79890 Hormone replacement therapy (postmenopausal): Secondary | ICD-10-CM | POA: Diagnosis not present

## 2018-04-29 DIAGNOSIS — I444 Left anterior fascicular block: Secondary | ICD-10-CM | POA: Insufficient documentation

## 2018-04-29 DIAGNOSIS — Z85818 Personal history of malignant neoplasm of other sites of lip, oral cavity, and pharynx: Secondary | ICD-10-CM | POA: Insufficient documentation

## 2018-04-29 DIAGNOSIS — E039 Hypothyroidism, unspecified: Secondary | ICD-10-CM | POA: Insufficient documentation

## 2018-04-29 DIAGNOSIS — N400 Enlarged prostate without lower urinary tract symptoms: Secondary | ICD-10-CM | POA: Diagnosis not present

## 2018-04-29 DIAGNOSIS — I081 Rheumatic disorders of both mitral and tricuspid valves: Secondary | ICD-10-CM | POA: Insufficient documentation

## 2018-04-29 DIAGNOSIS — I249 Acute ischemic heart disease, unspecified: Secondary | ICD-10-CM | POA: Diagnosis present

## 2018-04-29 DIAGNOSIS — Z87891 Personal history of nicotine dependence: Secondary | ICD-10-CM | POA: Insufficient documentation

## 2018-04-29 LAB — CBC WITH DIFFERENTIAL/PLATELET
Abs Immature Granulocytes: 0.01 10*3/uL (ref 0.00–0.07)
Basophils Absolute: 0 10*3/uL (ref 0.0–0.1)
Basophils Relative: 0 %
Eosinophils Absolute: 0.1 10*3/uL (ref 0.0–0.5)
Eosinophils Relative: 2 %
HCT: 43.4 % (ref 39.0–52.0)
Hemoglobin: 14.3 g/dL (ref 13.0–17.0)
Immature Granulocytes: 0 %
Lymphocytes Relative: 25 %
Lymphs Abs: 1.7 10*3/uL (ref 0.7–4.0)
MCH: 31.9 pg (ref 26.0–34.0)
MCHC: 32.9 g/dL (ref 30.0–36.0)
MCV: 96.9 fL (ref 80.0–100.0)
Monocytes Absolute: 0.6 10*3/uL (ref 0.1–1.0)
Monocytes Relative: 8 %
Neutro Abs: 4.5 10*3/uL (ref 1.7–7.7)
Neutrophils Relative %: 65 %
Platelets: 208 10*3/uL (ref 150–400)
RBC: 4.48 MIL/uL (ref 4.22–5.81)
RDW: 13.4 % (ref 11.5–15.5)
WBC: 7 10*3/uL (ref 4.0–10.5)
nRBC: 0 % (ref 0.0–0.2)

## 2018-04-29 LAB — I-STAT TROPONIN, ED: Troponin i, poc: 0.02 ng/mL (ref 0.00–0.08)

## 2018-04-29 LAB — BASIC METABOLIC PANEL
Anion gap: 9 (ref 5–15)
BUN: 22 mg/dL (ref 8–23)
CHLORIDE: 105 mmol/L (ref 98–111)
CO2: 28 mmol/L (ref 22–32)
CREATININE: 1.23 mg/dL (ref 0.61–1.24)
Calcium: 9.6 mg/dL (ref 8.9–10.3)
GFR calc Af Amer: >60 mL/min (ref >60)
GFR calc non Af Amer: 53 mL/min — ABNORMAL LOW (ref >60)
Glucose, Bld: 110 mg/dL — ABNORMAL HIGH (ref 70–99)
Potassium: 4.4 mmol/L (ref 3.5–5.1)
Sodium: 142 mmol/L (ref 135–145)

## 2018-04-29 LAB — TROPONIN I
Troponin I: <0.03 ng/mL (ref <0.03)
Troponin I: <0.03 ng/mL (ref <0.03)

## 2018-04-29 LAB — TSH: TSH: 2.253 u[IU]/mL (ref 0.350–4.500)

## 2018-04-29 MED ORDER — MIRTAZAPINE 15 MG PO TABS
15.0000 mg | ORAL_TABLET | Freq: Every day | ORAL | Status: DC
  Administered 2018-04-29 – 2018-04-30 (×2): 15 mg via ORAL
  Filled 2018-04-29 (×2): qty 1

## 2018-04-29 MED ORDER — FINASTERIDE 5 MG PO TABS
5.0000 mg | ORAL_TABLET | Freq: Every day | ORAL | Status: DC
  Administered 2018-04-29 – 2018-04-30 (×2): 5 mg via ORAL
  Filled 2018-04-29 (×2): qty 1

## 2018-04-29 MED ORDER — MECLIZINE HCL 25 MG PO TABS
25.0000 mg | ORAL_TABLET | Freq: Three times a day (TID) | ORAL | Status: DC | PRN
  Filled 2018-04-29: qty 1

## 2018-04-29 MED ORDER — ACETAMINOPHEN 325 MG PO TABS
650.0000 mg | ORAL_TABLET | ORAL | Status: DC | PRN
  Administered 2018-04-30: 650 mg via ORAL
  Filled 2018-04-29: qty 2

## 2018-04-29 MED ORDER — DIGOXIN 125 MCG PO TABS
0.6250 mg | ORAL_TABLET | Freq: Every day | ORAL | Status: DC
  Filled 2018-04-29: qty 5

## 2018-04-29 MED ORDER — ASPIRIN 300 MG RE SUPP: 300.0000 mg | RECTAL | Status: AC

## 2018-04-29 MED ORDER — ASPIRIN EC 81 MG PO TBEC
81.0000 mg | DELAYED_RELEASE_TABLET | Freq: Every day | ORAL | Status: DC
  Administered 2018-04-30: 81 mg via ORAL
  Filled 2018-04-29: qty 1

## 2018-04-29 MED ORDER — HYDROCHLOROTHIAZIDE 25 MG PO TABS
12.5000 mg | ORAL_TABLET | Freq: Every day | ORAL | Status: DC
  Administered 2018-04-29 – 2018-04-30 (×2): 12.5 mg via ORAL
  Filled 2018-04-29 (×2): qty 1

## 2018-04-29 MED ORDER — FOLIC ACID 1 MG PO TABS
0.5000 mg | ORAL_TABLET | Freq: Every day | ORAL | Status: DC
  Administered 2018-04-29 – 2018-04-30 (×2): 0.5 mg via ORAL
  Filled 2018-04-29 (×2): qty 1

## 2018-04-29 MED ORDER — NITROGLYCERIN 0.4 MG SL SUBL: 0.4000 mg | SUBLINGUAL_TABLET | SUBLINGUAL | Status: DC | PRN

## 2018-04-29 MED ORDER — LEVOTHYROXINE SODIUM 75 MCG PO TABS
75.0000 ug | ORAL_TABLET | Freq: Every day | ORAL | Status: DC
  Administered 2018-04-30: 75 ug via ORAL
  Filled 2018-04-29: qty 1

## 2018-04-29 MED ORDER — ONDANSETRON HCL 4 MG/2ML IJ SOLN: 4.0000 mg | Freq: Four times a day (QID) | INTRAMUSCULAR | Status: DC | PRN

## 2018-04-29 MED ORDER — METOPROLOL TARTRATE 12.5 MG HALF TABLET
12.5000 mg | ORAL_TABLET | Freq: Two times a day (BID) | ORAL | Status: DC
  Administered 2018-04-29 – 2018-04-30 (×4): 12.5 mg via ORAL
  Filled 2018-04-29 (×4): qty 1

## 2018-04-29 MED ORDER — DILTIAZEM HCL 60 MG PO TABS
60.0000 mg | ORAL_TABLET | Freq: Two times a day (BID) | ORAL | Status: DC
  Administered 2018-04-29 – 2018-04-30 (×3): 60 mg via ORAL
  Filled 2018-04-29 (×3): qty 1

## 2018-04-29 MED ORDER — SODIUM CHLORIDE 0.9 % IV BOLUS
500.0000 mL | Freq: Once | INTRAVENOUS | Status: AC
  Administered 2018-04-29: 500 mL via INTRAVENOUS

## 2018-04-29 MED ORDER — ADULT MULTIVITAMIN W/MINERALS CH
1.0000 | ORAL_TABLET | Freq: Every day | ORAL | Status: DC
  Administered 2018-04-29 – 2018-04-30 (×2): 1 via ORAL
  Filled 2018-04-29 (×2): qty 1

## 2018-04-29 MED ORDER — DIGOXIN 125 MCG PO TABS
0.0625 mg | ORAL_TABLET | Freq: Every day | ORAL | Status: DC
  Administered 2018-04-29 – 2018-04-30 (×2): 0.0625 mg via ORAL
  Filled 2018-04-29 (×2): qty 1

## 2018-04-29 MED ORDER — DILTIAZEM HCL 30 MG PO TABS
30.0000 mg | ORAL_TABLET | Freq: Two times a day (BID) | ORAL | Status: DC
  Administered 2018-04-29: 30 mg via ORAL
  Filled 2018-04-29 (×2): qty 1

## 2018-04-29 MED ORDER — ASPIRIN 81 MG PO CHEW
324.0000 mg | CHEWABLE_TABLET | ORAL | Status: AC
  Administered 2018-04-29: 324 mg via ORAL
  Filled 2018-04-29: qty 4

## 2018-04-29 MED ORDER — FOLIC ACID 0.5 MG HALF TAB
500.0000 ug | ORAL_TABLET | Freq: Every day | ORAL | Status: DC
  Filled 2018-04-29 (×2): qty 1

## 2018-04-29 MED ORDER — VITAMIN B-12 1000 MCG PO TABS
1000.0000 ug | ORAL_TABLET | Freq: Every day | ORAL | Status: DC
  Administered 2018-04-29 – 2018-04-30 (×2): 1000 ug via ORAL
  Filled 2018-04-29 (×2): qty 1

## 2018-04-29 MED ORDER — RIVAROXABAN 20 MG PO TABS
20.0000 mg | ORAL_TABLET | Freq: Every day | ORAL | Status: DC
  Administered 2018-04-29: 20 mg via ORAL
  Filled 2018-04-29 (×3): qty 1

## 2018-04-29 MED ORDER — METOPROLOL TARTRATE 5 MG/5ML IV SOLN
2.5000 mg | Freq: Once | INTRAVENOUS | Status: AC
  Administered 2018-04-29: 2.5 mg via INTRAVENOUS
  Filled 2018-04-29: qty 5

## 2018-04-29 MED ORDER — TAMSULOSIN HCL 0.4 MG PO CAPS
0.4000 mg | ORAL_CAPSULE | Freq: Every day | ORAL | Status: DC
  Administered 2018-04-29 – 2018-04-30 (×2): 0.4 mg via ORAL
  Filled 2018-04-29 (×2): qty 1

## 2018-04-29 NOTE — ED Provider Notes (Signed)
[facility] EMERGENCY DEPARTMENT Provider Note   CSN: [id number] Arrival date & time: 04/29/18  8502     History   Chief Complaint Chief Complaint  Patient presents with  . Atrial Fibrillation    HPI Corey Marshall is a 85 y.o. male.  HPI  85 year old male with a history of paroxysmal atrial fibrillation who is on Xarelto presents with recurrent A. fib.  He felt himself going to A. fib last night with "heart pounding" sensation.  That seem to go away by the time he is going to bed but then came back this morning sometime after getting up and take care of his wife.  He does not feel short of breath or have chest pain/pressure.  However he feels diffusely weak/fatigued.  He does feel little lightheaded.  This tends to happen to him about every 2 weeks or so and typically will go away.  However his cardiologist, Dr. Doylene Canard, has recommended he come into the hospital when he feels this way.  He has been compliant with his other medicines.  He no longer feels the palpitations and at rest feels okay but he knows that if he were to get up to walk he would probably be too weak.  He has chronic mild lower extremity edema that is unchanged from baseline.  Past Medical History:  Diagnosis Date  . Atrial fibrillation ([place])   . Cancer (HCC)    HS OF THROAT CANCER  . Complication of anesthesia   . Enlarged prostate   . Family history of adverse reaction to anesthesia    NAUSEA  . Hypertension   . Hypothyroidism   . Peripheral neuralgia   . PONV (postoperative nausea and vomiting)   . Trigger finger     Patient Active Problem List   Diagnosis Date Noted  . Weakness generalized 07/19/2016  . ARF (acute renal failure) ([place]) 07/19/2016  . PAF (paroxysmal atrial fibrillation) ([place]) 07/19/2016  . Essential hypertension 07/19/2016  . Normocytic anemia 07/19/2016  . History of throat cancer 07/19/2016    Past Surgical History:  Procedure Laterality Date  . APPENDECTOMY      . CARPAL TUNNEL RELEASE Bilateral   . KNEE SURGERY    . throat cancer surgery          Home Medications    Prior to Admission medications   Medication Sig Start Date End Date Taking? Authorizing Provider  cyanocobalamin 1000 MCG tablet Take 1,000 mcg by mouth daily.     [provider]  finasteride (PROSCAR) 5 MG tablet Take 5 mg by mouth daily.    [provider]  folic acid (FOLVITE) 774 MCG tablet Take 400 mcg by mouth daily.    [provider]  levothyroxine (SYNTHROID, LEVOTHROID) 25 MCG tablet Take 37.5 mcg by mouth daily before breakfast.     [provider]  meclizine (ANTIVERT) 25 MG tablet Take 25 mg by mouth 3 (three) times daily as needed for dizziness.    [provider]  metoprolol tartrate (LOPRESSOR) 25 MG tablet Take 12.5 mg by mouth 2 (two) times daily.     [provider]  Multiple Vitamins-Minerals (MULTIVITAMIN WITH MINERALS) tablet Take 1 tablet by mouth at bedtime.     [provider]  ranitidine (ZANTAC) 150 MG tablet Take 150 mg by mouth daily.     [provider]  tamsulosin (FLOMAX) 0.4 MG CAPS capsule Take 0.4 mg by mouth at bedtime.    [provider]  Family History Family History  Problem Relation Age of Onset  . CAD Neg Hx   . Diabetes Mellitus II Neg Hx     Social History Social History   Tobacco Use  . Smoking status: Former Research scientist (life sciences)  . Smokeless tobacco: Never Used  Substance Use Topics  . Alcohol use: No  . Drug use: No     Allergies   Darvon [propoxyphene] and Demerol [meperidine]   Review of Systems Review of Systems  Constitutional: Positive for fatigue.  Respiratory: Negative for shortness of breath.   Cardiovascular: Positive for palpitations. Negative for chest pain.  Neurological: Positive for light-headedness.  All other systems reviewed and are negative.    Physical Exam Updated Vital Signs BP (!) 162/114 (BP Location: Right Arm)    Pulse (!) 102   Temp (!) 97.3 F (36.3 C) (Oral)   Resp 16   SpO2 98%   Physical Exam Vitals signs and nursing note reviewed.  Constitutional:      General: He is not in acute distress.    Appearance: He is well-developed. He is not ill-appearing or diaphoretic.  HENT:     Head: Normocephalic and atraumatic.     Right Ear: External ear normal.     Left Ear: External ear normal.     Nose: Nose normal.  Eyes:     General:        Right eye: No discharge.        Left eye: No discharge.  Neck:     Musculoskeletal: Neck supple.  Cardiovascular:     Rate and Rhythm: Tachycardia present. Rhythm irregular.     Heart sounds: Normal heart sounds.     Comments: HR low 100s Pulmonary:     Effort: Pulmonary effort is normal.     Breath sounds: Normal breath sounds. No wheezing or rales.  Abdominal:     Palpations: Abdomen is soft.     Tenderness: There is no abdominal tenderness.  Musculoskeletal:     Right lower leg: No edema.     Left lower leg: No edema.  Skin:    General: Skin is warm and dry.  Neurological:     Mental Status: He is alert.     Comments: 5/5 strength in all 4 extremities.  Psychiatric:        Mood and Affect: Mood is not anxious.      ED Treatments / Results  Labs (all labs ordered are listed, but only abnormal results are displayed) Labs Reviewed  BASIC METABOLIC PANEL - Abnormal; Notable for the following components:      Result Value   Glucose, Bld 110 (*)    GFR calc non Af Amer 53 (*)    All other components within normal limits  CBC WITH DIFFERENTIAL/PLATELET  TROPONIN I  TROPONIN I  TSH  I-STAT TROPONIN, ED    EKG EKG Interpretation  Date/Time:  Tuesday April 29 2018 09:07:03 EST Ventricular Rate:  106 PR Interval:    QRS Duration: 102 QT Interval:  379 QTC Calculation: 511 R Axis:   -56 Text Interpretation:  Atrial fibrillation Ventricular premature complex Left anterior fascicular block Nonspecific T abnormalities, lateral leads  Prolonged QT interval afib new since April 2018 Confirmed by Sherwood Gambler [phone]) on 04/29/2018 9:17:00 AM   EKG Interpretation  Date/Time:  Tuesday April 29 2018 11:26:36 EST Ventricular Rate:  58 PR Interval:    QRS Duration: 103 QT Interval:  414 QTC Calculation: 407 R Axis:   -55  Text Interpretation:  Sinus bradycardia Left anterior fascicular block Borderline ST elevation, anterior leads Afib resolved Confirmed by Sherwood Gambler [phone]) on 04/29/2018 11:36:44 AM Also confirmed by Sherwood Gambler [phone]), editor Philomena Doheny [phone])  on 04/29/2018 11:59:02 AM       Radiology Dg Chest 2 View  Result Date: 04/29/2018 CLINICAL DATA:  Atrial fibrillation and weakness EXAM: CHEST - 2 VIEW COMPARISON:  07/18/2016 FINDINGS: Cardiac shadow is stable. Aortic calcifications are again noted. The lungs are clear bilaterally. Mild degenerative changes of the thoracic spine are noted. IMPRESSION: No acute abnormality noted. Electronically Signed   By: Inez Catalina M.D.   On: 04/29/2018 09:48    Procedures Procedures (including critical care time)  Medications Ordered in ED Medications  metoprolol tartrate (LOPRESSOR) injection 2.5 mg (has no administration in time range)  sodium chloride 0.9 % bolus 500 mL (has no administration in time range)     Initial Impression / Assessment and Plan / ED Course  I have reviewed the triage vital signs and the nursing notes.  Pertinent labs & imaging results that were available during my care of the patient were reviewed by me and considered in my medical decision making (see chart for details).     Patient appears to have symptomatic paroxysmal A. fib.  This appears to happen fairly often.  I gave him a dose of IV metoprolol and it seems that his A. fib has changed back to sinus and he is now having a heart rate around 60.  On reevaluation he did tell me that for about an hour while in the ED he did have some left-sided chest discomfort.  I  discussed with his cardiologist, Dr. Doylene Canard, who would like to admit for ops.  CHA2DS2/VAS Stroke Risk Points  Current as of 3 days ago (Saturday)     3 >= 2 Points: High Risk  1 - 1.99 Points: Medium Risk  0 Points: Low Risk    This is the only CHA2DS2/VAS Stroke Risk Points available for the past  year.:  Last Change: N/A     Details    This score determines the patient's risk of having a stroke if the  patient has atrial fibrillation.       Points Metrics  0 Has Congestive Heart Failure:  No    Current as of 3 days ago (Saturday)  0 Has Vascular Disease:  No    Current as of 3 days ago (Saturday)  1 Has Hypertension:  Yes    Current as of 3 days ago (Saturday)  2 Age:  22    Current as of 3 days ago (Saturday)  0 Has Diabetes:  No    Current as of 3 days ago (Saturday)  0 Had Stroke:  No  Had TIA:  No  Had thromboembolism:  No    Current as of 3 days ago (Saturday)  0 Male:  No    Current as of 3 days ago (Saturday)            Final Clinical Impressions(s) / ED Diagnoses   Final diagnoses:  Paroxysmal atrial fibrillation [facility])    ED Discharge Orders    None       Sherwood Gambler, MD 04/29/18 [phone]

## 2018-04-29 NOTE — ED Triage Notes (Signed)
Pt arrives to ED from home with complaints of palpitations, dizziness, and weakness this morning while he was exerting himself. Pt has hx of atrial fibrillation and is on xarelto. Pt in a fib on assessment with rate controlled. Pt placed in position of comfort with bed locked and lowered, call bell in reach.

## 2018-04-29 NOTE — H&P (Signed)
Referring Physician:  Trevonne Nyland is an 85 y.o. male.                       Chief Complaint: Chest pain and palpitation  HPI: 85 year old male with h/o paroxysmal atrial fibrillation treated with B-blocker and Dilatiazem along with Xarelto had palpitations off and on x 3 weeks. He also has chest pain occasionally. He has PMH of hypothyroidism and hypertension. He is back in sinus rhythm.  Past Medical History:  Diagnosis Date  . Atrial fibrillation ([place])   . Cancer (HCC)    HS OF THROAT CANCER  . Complication of anesthesia   . Enlarged prostate   . Family history of adverse reaction to anesthesia    NAUSEA  . Hypertension   . Hypothyroidism   . Peripheral neuralgia   . PONV (postoperative nausea and vomiting)   . Trigger finger       Past Surgical History:  Procedure Laterality Date  . APPENDECTOMY    . CARPAL TUNNEL RELEASE Bilateral   . KNEE SURGERY    . throat cancer surgery      Family History  Problem Relation Age of Onset  . CAD Neg Hx   . Diabetes Mellitus II Neg Hx    Social History:  reports that he has quit smoking. He has never used smokeless tobacco. He reports that he does not drink alcohol or use drugs.  Allergies:  Allergies  Allergen Reactions  . Darvon [Propoxyphene] Nausea And Vomiting    Unknown  . Demerol [Meperidine] Nausea And Vomiting    Unknown    (Not in a hospital admission)   Results for orders placed or performed during the hospital encounter of 04/29/18 (from the past 48 hour(s))  Basic metabolic panel     Status: Abnormal   Collection Time: 04/29/18  9:15 AM  Result Value Ref Range   Sodium 142 135 - 145 mmol/L   Potassium 4.4 3.5 - 5.1 mmol/L   Chloride 105 98 - 111 mmol/L   CO2 28 22 - 32 mmol/L   Glucose, Bld 110 (H) 70 - 99 mg/dL   BUN 22 8 - 23 mg/dL   Creatinine, Ser 1.23 0.61 - 1.24 mg/dL   Calcium 9.6 8.9 - 10.3 mg/dL   GFR calc non Af Amer 53 (L) >60 mL/min   GFR calc Af Amer >60 >60 mL/min   Anion gap 9 5 - 15     Comment: Performed at [facility], [place] [address] [place], [place] [address]  CBC with Differential     Status: None   Collection Time: 04/29/18  9:15 AM  Result Value Ref Range   WBC 7.0 4.0 - 10.5 K/uL   RBC 4.48 4.22 - 5.81 MIL/uL   Hemoglobin 14.3 13.0 - 17.0 g/dL   HCT 43.4 39.0 - 52.0 %   MCV 96.9 80.0 - 100.0 fL   MCH 31.9 26.0 - 34.0 pg   MCHC 32.9 30.0 - 36.0 g/dL   RDW 13.4 11.5 - 15.5 %   Platelets 208 150 - 400 K/uL   nRBC 0.0 0.0 - 0.2 %   Neutrophils Relative % 65 %   Neutro Abs 4.5 1.7 - 7.7 K/uL   Lymphocytes Relative 25 %   Lymphs Abs 1.7 0.7 - 4.0 K/uL   Monocytes Relative 8 %   Monocytes Absolute 0.6 0.1 - 1.0 K/uL   Eosinophils Relative 2 %   Eosinophils Absolute 0.1  0.0 - 0.5 K/uL   Basophils Relative 0 %   Basophils Absolute 0.0 0.0 - 0.1 K/uL   Immature Granulocytes 0 %   Abs Immature Granulocytes 0.01 0.00 - 0.07 K/uL    Comment: Performed at [facility] [address], [place], [place] [address]  I-stat troponin, ED     Status: None   Collection Time: 04/29/18  9:43 AM  Result Value Ref Range   Troponin i, poc 0.02 0.00 - 0.08 ng/mL   Comment 3            Comment: Due to the release kinetics of cTnI, a negative result within the first hours of the onset of symptoms does not rule out myocardial infarction with certainty. If myocardial infarction is still suspected, repeat the test at appropriate intervals.    Dg Chest 2 View  Result Date: 04/29/2018 CLINICAL DATA:  Atrial fibrillation and weakness EXAM: CHEST - 2 VIEW COMPARISON:  07/18/2016 FINDINGS: Cardiac shadow is stable. Aortic calcifications are again noted. The lungs are clear bilaterally. Mild degenerative changes of the thoracic spine are noted. IMPRESSION: No acute abnormality noted. Electronically Signed   By: Inez Catalina M.D.   On: 04/29/2018 09:48    Review Of Systems Constitutional: No fever, chills, weight loss or gain. Eyes: Positive vision change, wears  glasses. No discharge or pain. Ears: Positive hearing loss, No tinnitus. Respiratory: No asthma, COPD, pneumonias. No shortness of breath. No hemoptysis. Cardiovascular: Positive chest pain, palpitation, no leg edema. Gastrointestinal: No nausea, vomiting, diarrhea, constipation. No GI bleed. No hepatitis. Genitourinary: No dysuria, hematuria, kidney stone. No incontinance. Neurological: No headache, stroke, seizures. Positive dizziness. Psychiatry: No psych facility admission for anxiety, depression, suicide. No detox. Skin: No rash. Musculoskeletal: Positive joint pain, no fibromyalgia. No neck pain, back pain. Lymphadenopathy: No lymphadenopathy. Hematology: No anemia or easy bruising.   Blood pressure 121/85, pulse 61, temperature (!) 97.3 F (36.3 C), temperature source Oral, resp. rate 17, SpO2 96 %. There is no height or weight on file to calculate BMI. General appearance: alert, cooperative, appears stated age and no distress Head: Normocephalic, atraumatic. Eyes: Hazel eyes, pink conjunctiva, corneas clear. PERRL, EOM's intact. Neck: No adenopathy, no carotid bruit, no JVD, supple, symmetrical, trachea midline and thyroid not enlarged. Resp: Clear to auscultation bilaterally. Cardio: Regular rate and rhythm, S1, S2 normal, III/VI systolic murmur, no click, rub or gallop GI: Soft, non-tender; bowel sounds normal; no organomegaly. Extremities: No edema, cyanosis or clubbing. Skin: Warm and dry.  Neurologic: Alert and oriented X 3, normal strength. Normal coordination and slow gait.  Assessment/Plan Paroxysmal atrial fibrillation, CHA2DS2VASc score of 4 HTN BPH Hypothyroidism S/P throat cancer  Place in observation. Adjust medications. R/O MI. Echocardiogram for LVF/LA/RA.  Birdie Riddle, MD  04/29/2018, 11:57 AM

## 2018-04-29 NOTE — Discharge Instructions (Signed)

## 2018-04-30 ENCOUNTER — Observation Stay (HOSPITAL_COMMUNITY): Payer: Medicare Other

## 2018-04-30 LAB — BASIC METABOLIC PANEL
Anion gap: 5 (ref 5–15)
BUN: 21 mg/dL (ref 8–23)
CO2: 30 mmol/L (ref 22–32)
Calcium: 9.1 mg/dL (ref 8.9–10.3)
Chloride: 106 mmol/L (ref 98–111)
Creatinine, Ser: 1.25 mg/dL — ABNORMAL HIGH (ref 0.61–1.24)
GFR calc Af Amer: >60 mL/min (ref >60)
GFR calc non Af Amer: 52 mL/min — ABNORMAL LOW (ref >60)
Glucose, Bld: 102 mg/dL — ABNORMAL HIGH (ref 70–99)
Potassium: 4.3 mmol/L (ref 3.5–5.1)
Sodium: 141 mmol/L (ref 135–145)

## 2018-04-30 LAB — LIPID PANEL
CHOL/HDL RATIO: 4.3 ratio
Cholesterol: 132 mg/dL (ref 0–200)
HDL: 31 mg/dL — ABNORMAL LOW (ref >40)
LDL Cholesterol: 88 mg/dL (ref 0–99)
Triglycerides: 67 mg/dL (ref <150)
VLDL: 13 mg/dL (ref 0–40)

## 2018-04-30 LAB — CBC
HCT: 35.7 % — ABNORMAL LOW (ref 39.0–52.0)
Hemoglobin: 11.9 g/dL — ABNORMAL LOW (ref 13.0–17.0)
MCH: 32.1 pg (ref 26.0–34.0)
MCHC: 33.3 g/dL (ref 30.0–36.0)
MCV: 96.2 fL (ref 80.0–100.0)
Platelets: 173 10*3/uL (ref 150–400)
RBC: 3.71 MIL/uL — ABNORMAL LOW (ref 4.22–5.81)
RDW: 13.4 % (ref 11.5–15.5)
WBC: 5.4 10*3/uL (ref 4.0–10.5)
nRBC: 0 % (ref 0.0–0.2)

## 2018-04-30 LAB — ECHOCARDIOGRAM COMPLETE
Height: 70.5 in
Weight: 3104.08 oz

## 2018-04-30 LAB — TROPONIN I: Troponin I: <0.03 ng/mL (ref <0.03)

## 2018-04-30 MED ORDER — TECHNETIUM TC 99M TETROFOSMIN IV KIT
30.0000 | PACK | Freq: Once | INTRAVENOUS | Status: AC | PRN
  Administered 2018-04-30: 30 via INTRAVENOUS

## 2018-04-30 MED ORDER — REGADENOSON 0.4 MG/5ML IV SOLN
0.4000 mg | Freq: Once | INTRAVENOUS | Status: AC
  Administered 2018-04-30: 0.4 mg via INTRAVENOUS

## 2018-04-30 MED ORDER — REGADENOSON 0.4 MG/5ML IV SOLN
INTRAVENOUS | Status: AC
  Administered 2018-04-30: 14:00:00
  Filled 2018-04-30: qty 5

## 2018-04-30 MED ORDER — TECHNETIUM TC 99M TETROFOSMIN IV KIT
10.0000 | PACK | Freq: Once | INTRAVENOUS | Status: AC | PRN
  Administered 2018-04-30: 10 via INTRAVENOUS

## 2018-04-30 MED ORDER — DILTIAZEM HCL 60 MG PO TABS: 60.0000 mg | ORAL_TABLET | Freq: Two times a day (BID) | ORAL | 3 refills | Status: AC

## 2018-04-30 MED ORDER — NITROGLYCERIN 0.4 MG SL SUBL: 0.4000 mg | SUBLINGUAL_TABLET | SUBLINGUAL | 1 refills | Status: AC | PRN

## 2018-04-30 NOTE — Progress Notes (Signed)
Nurse reviewed discharge instructions with patient and family. Bedtime meds given per request before discharge. Pt hypertensive at discharge, provider aware. Encouraged and reminded to set appointment with cardiologist. Pt denies pain. Alert and oriented x4. Verbalized understanding of discharge paperwork. Pt assisted to vehicle by wheelchair.

## 2018-04-30 NOTE — Discharge Summary (Signed)
Physician Discharge Summary  Patient ID: Corey Marshall MRN: [id number] DOB/AGE: [id number] 83 y.o.  Admit date: 04/29/2018 Discharge date: 04/30/2018  Admission Diagnoses: Paroxysmal atrial fibrillation, CHA2DS2VASc score of 4 HTN BPH Hypothyroidism S/P throat cancer  Discharge Diagnoses:  Principle problem: Chest pain Active Problems:   Paroxysmal atrial fibrillation   HTN   BPH   Hypothyroidism   S/P throat cancer   Mild ascending aortic dilatation   Mild MR and Mild TR   Moderate to severely dilated left atrium  Discharged Condition: fair  Hospital Course: 85 year old male with paroxysmal atrial fibrillation, treated with metoprolol, diltiazem and Xarelto had palpitations off and on x 3 weeks. He also had chest pain and weakness. Post IV 2.5 mg. Metoprolol he is back in sinus rhythm. His NM myocardial perfusion stress test was negative for reversible ischemia.  His echocardiogram showed mild LVH with preserved LV systolic function, mild MR and TR and dilated LA and RA. His diltiazem dose was increased to 60 mg. twice daily, continue metoprolol and digoxin at previous dose. EP testing and ablation were discussed and patient agrees to  He will see me in 1 week and see primary care physician in 1 month.  Consults: cardiology  Significant Diagnostic Studies: labs: BMET is near normal, CBC is near normal, Lipid panel near normal with LDL cholesterol of 88 mg. HDL of 31 mg. and total cholesterol of 132 mg. TSH was normal. Troponin I was normal x 3.  EKG: Atrial fibrillation, subsequent EKG (post 2.5 mg. Troponin IV): Sinus rhythm.  Nuclear stress test: Negative for reversible ischemia.  Treatments: cardiac meds: metoprolol, diltiazem, digoxin, Xarelto, SL NTG and hydrochlorothiazide.  Discharge Exam: Blood pressure 139/74, pulse (!) 51, temperature 97.7 F (36.5 C), temperature source Oral, resp. rate 16, height 5' 10.5" (1.791 m), weight 87.6 kg, SpO2 97 %. General  appearance: alert, cooperative and appears stated age. Head: Normocephalic, atraumatic. Eyes: Hazel eyes, pink conjunctiva, corneas clear. PERRL, EOM's intact.  Neck: No adenopathy, no carotid bruit, no JVD, supple, symmetrical, trachea midline and thyroid not enlarged. Resp: Clear to auscultation bilaterally. Cardio: Regular rate and rhythm, S1, S2 normal, II/VI systolic murmur, no click, rub or gallop. GI: Soft, non-tender; bowel sounds normal; no organomegaly. Extremities: No edema, cyanosis or clubbing. Skin: Warm and dry.  Neurologic: Alert and oriented X 3, normal strength and tone. Normal coordination and slow gait.  Disposition: Discharge disposition: 01-Home or Self Care        Allergies as of 04/30/2018      Reactions   Darvon [propoxyphene] Nausea And Vomiting   Unknown   Demerol [meperidine] Nausea And Vomiting   Unknown      Medication List    TAKE these medications   digoxin 0.125 MG tablet Commonly known as:  LANOXIN Take 0.0625 mg by mouth daily.   diltiazem 60 MG tablet Commonly known as:  CARDIZEM Take 1 tablet (60 mg total) by mouth 2 (two) times daily. What changed:    medication strength  how much to take   finasteride 5 MG tablet Commonly known as:  PROSCAR Take 5 mg by mouth daily.   folic acid 409 MCG tablet Commonly known as:  FOLVITE Take 400 mcg by mouth daily.   hydrochlorothiazide 25 MG tablet Commonly known as:  HYDRODIURIL Take 12.5 mg by mouth daily.   Levothyroxine Sodium 75 MCG Caps Take 75 mcg by mouth daily before breakfast.   meclizine 25 MG tablet Commonly known as:  ANTIVERT Take  25 mg by mouth 3 (three) times daily as needed for dizziness.   metoprolol tartrate 25 MG tablet Commonly known as:  LOPRESSOR Take 12.5 mg by mouth 2 (two) times daily.   mirtazapine 15 MG tablet Commonly known as:  REMERON Take 15 mg by mouth at bedtime.   multivitamin with minerals tablet Take 1 tablet by mouth at bedtime.    nitroGLYCERIN 0.4 MG SL tablet Commonly known as:  NITROSTAT Place 1 tablet (0.4 mg total) under the tongue every 5 (five) minutes x 3 doses as needed for chest pain.   rivaroxaban 20 MG Tabs tablet Commonly known as:  XARELTO Take 20 mg by mouth at bedtime.   tamsulosin 0.4 MG Caps capsule Commonly known as:  FLOMAX Take 0.4 mg by mouth at bedtime.   vitamin B-12 1000 MCG tablet Commonly known as:  CYANOCOBALAMIN Take 1,000 mcg by mouth daily.      Follow-up Information    Rich Fuchs, PA. Schedule an appointment as soon as possible for a visit in 1 month(s).   Specialty:  Physician Assistant Contact information: [place] [address] [phone]        Dixie Dials, MD. Schedule an appointment as soon as possible for a visit in 1 week(s).   Specialty:  Cardiology Contact information: [place] [address] [phone]           Signed: Birdie Riddle 04/30/2018, 6:41 PM

## 2018-04-30 NOTE — Progress Notes (Signed)
Lexiscan complete. No complaints noted. Patient given snack and beverage.

## 2018-04-30 NOTE — Care Management Obs Status (Signed)
[facility] NOTIFICATION   Patient Details  Name: Manfred Laspina MRN: [id number] Date of Birth: January 06, 1931   Medicare Observation Status Notification Given:  Yes    Erenest Rasher, RN 04/30/2018, 3:56 PM

## 2021-01-25 ENCOUNTER — Encounter (HOSPITAL_BASED_OUTPATIENT_CLINIC_OR_DEPARTMENT_OTHER): Payer: Self-pay

## 2021-01-25 ENCOUNTER — Other Ambulatory Visit: Payer: Self-pay

## 2021-01-25 ENCOUNTER — Emergency Department (HOSPITAL_BASED_OUTPATIENT_CLINIC_OR_DEPARTMENT_OTHER): Payer: Medicare Other

## 2021-01-25 ENCOUNTER — Emergency Department (HOSPITAL_BASED_OUTPATIENT_CLINIC_OR_DEPARTMENT_OTHER)
Admission: EM | Admit: 2021-01-25 | Discharge: 2021-01-25 | Disposition: A | Discharge status: Home / Self Care | Payer: Medicare Other | Attending: Emergency Medicine | Admitting: Emergency Medicine

## 2021-01-25 DIAGNOSIS — I10 Essential (primary) hypertension: Secondary | ICD-10-CM | POA: Insufficient documentation

## 2021-01-25 DIAGNOSIS — Z8521 Personal history of malignant neoplasm of larynx: Secondary | ICD-10-CM | POA: Diagnosis not present

## 2021-01-25 DIAGNOSIS — S20419A Abrasion of unspecified back wall of thorax, initial encounter: Secondary | ICD-10-CM

## 2021-01-25 DIAGNOSIS — Z96641 Presence of right artificial hip joint: Secondary | ICD-10-CM | POA: Diagnosis not present

## 2021-01-25 DIAGNOSIS — S51011A Laceration without foreign body of right elbow, initial encounter: Secondary | ICD-10-CM

## 2021-01-25 DIAGNOSIS — W19XXXA Unspecified fall, initial encounter: Secondary | ICD-10-CM

## 2021-01-25 DIAGNOSIS — I4891 Unspecified atrial fibrillation: Secondary | ICD-10-CM | POA: Diagnosis not present

## 2021-01-25 DIAGNOSIS — Z87891 Personal history of nicotine dependence: Secondary | ICD-10-CM | POA: Insufficient documentation

## 2021-01-25 DIAGNOSIS — W010XXA Fall on same level from slipping, tripping and stumbling without subsequent striking against object, initial encounter: Secondary | ICD-10-CM | POA: Insufficient documentation

## 2021-01-25 DIAGNOSIS — R52 Pain, unspecified: Secondary | ICD-10-CM

## 2021-01-25 DIAGNOSIS — Z79899 Other long term (current) drug therapy: Secondary | ICD-10-CM | POA: Insufficient documentation

## 2021-01-25 DIAGNOSIS — S31010A Laceration without foreign body of lower back and pelvis without penetration into retroperitoneum, initial encounter: Secondary | ICD-10-CM | POA: Insufficient documentation

## 2021-01-25 DIAGNOSIS — E039 Hypothyroidism, unspecified: Secondary | ICD-10-CM | POA: Diagnosis not present

## 2021-01-25 DIAGNOSIS — S3992XA Unspecified injury of lower back, initial encounter: Secondary | ICD-10-CM | POA: Diagnosis present

## 2021-01-25 LAB — BASIC METABOLIC PANEL
Anion gap: 8 (ref 5–15)
BUN: 21 mg/dL (ref 8–23)
CO2: 31 mmol/L (ref 22–32)
Calcium: 9.9 mg/dL (ref 8.9–10.3)
Chloride: 100 mmol/L (ref 98–111)
Creatinine, Ser: 1.14 mg/dL (ref 0.61–1.24)
GFR, Estimated: >60 mL/min (ref >60)
Glucose, Bld: 131 mg/dL — ABNORMAL HIGH (ref 70–99)
Potassium: 3.9 mmol/L (ref 3.5–5.1)
Sodium: 139 mmol/L (ref 135–145)

## 2021-01-25 LAB — CBC WITH DIFFERENTIAL/PLATELET
Abs Immature Granulocytes: 0.03 10*3/uL (ref 0.00–0.07)
Basophils Absolute: 0 10*3/uL (ref 0.0–0.1)
Basophils Relative: 0 %
Eosinophils Absolute: 0.1 10*3/uL (ref 0.0–0.5)
Eosinophils Relative: 1 %
HCT: 39.2 % (ref 39.0–52.0)
Hemoglobin: 13.2 g/dL (ref 13.0–17.0)
Immature Granulocytes: 0 %
Lymphocytes Relative: 15 %
Lymphs Abs: 1.3 10*3/uL (ref 0.7–4.0)
MCH: 33.4 pg (ref 26.0–34.0)
MCHC: 33.7 g/dL (ref 30.0–36.0)
MCV: 99.2 fL (ref 80.0–100.0)
Monocytes Absolute: 0.5 10*3/uL (ref 0.1–1.0)
Monocytes Relative: 5 %
Neutro Abs: 7.2 10*3/uL (ref 1.7–7.7)
Neutrophils Relative %: 79 %
Platelets: 186 10*3/uL (ref 150–400)
RBC: 3.95 MIL/uL — ABNORMAL LOW (ref 4.22–5.81)
RDW: 14.1 % (ref 11.5–15.5)
WBC: 9.2 10*3/uL (ref 4.0–10.5)
nRBC: 0 % (ref 0.0–0.2)

## 2021-01-25 LAB — PROTIME-INR
INR: 1.8 — ABNORMAL HIGH (ref 0.8–1.2)
Prothrombin Time: 20.5 seconds — ABNORMAL HIGH (ref 11.4–15.2)

## 2021-01-25 MED ORDER — ACETAMINOPHEN 325 MG PO TABS
650.0000 mg | ORAL_TABLET | Freq: Once | ORAL | Status: AC
  Administered 2021-01-25: 650 mg via ORAL
  Filled 2021-01-25: qty 2

## 2021-01-25 MED ORDER — IOHEXOL 300 MG/ML  SOLN
100.0000 mL | Freq: Once | INTRAMUSCULAR | Status: AC | PRN
  Administered 2021-01-25: 100 mL via INTRAVENOUS

## 2021-01-25 NOTE — ED Notes (Signed)
Pt placed in a gown and hooked up to the monitor with 5 lead, BP cuff and pulse ox 

## 2021-01-25 NOTE — ED Notes (Signed)
Bacitracin and non stick gauze placed on skin tear to lower back and rt elbow

## 2021-01-25 NOTE — ED Notes (Signed)
ED Provider at bedside. 

## 2021-01-25 NOTE — ED Provider Notes (Signed)
[facility] EMERGENCY DEPARTMENT Provider Note   CSN: [id number] Arrival date & time: 01/25/21  1658     History Chief Complaint  Patient presents with   Corey Marshall is a 87 y.o. male who presents with concern for swelling and pain to his lower back and buttocks after he tripped backwards and fell onto his buttocks on a ceramic dog bowl.  He did not break the bowl or cut his skin.  He is anticoagulated on Xarelto due to atrial fibrillation.  He denies any head trauma or LOC, nausea or vomiting, blurry or double vision.  I have personally reviewed this patient's medical records.  His history of A. fib on Xarelto, hypertension, history of throat cancer, ACS.  LVEF 66 5% on echo in 04/2018.  HPI     Past Medical History:  Diagnosis Date   Atrial fibrillation ([place])    Cancer ([place])    HS OF THROAT CANCER   Complication of anesthesia    Enlarged prostate    Family history of adverse reaction to anesthesia    NAUSEA   Hypertension    Hypothyroidism    Peripheral neuralgia    PONV (postoperative nausea and vomiting)    Trigger finger     Patient Active Problem List   Diagnosis Date Noted   Acute coronary syndrome ([place]) 04/29/2018   Weakness generalized 07/19/2016   ARF (acute renal failure) ([place]) 07/19/2016   PAF (paroxysmal atrial fibrillation) ([place]) 07/19/2016   Essential hypertension 07/19/2016   Normocytic anemia 07/19/2016   History of throat cancer 07/19/2016    Past Surgical History:  Procedure Laterality Date   APPENDECTOMY     CARPAL TUNNEL RELEASE Bilateral    JOINT REPLACEMENT     KNEE SURGERY     throat cancer surgery         Family History  Problem Relation Age of Onset   CAD Neg Hx    Diabetes Mellitus II Neg Hx     Social History   Tobacco Use   Smoking status: Former   Smokeless tobacco: Never  Scientific laboratory technician Use: Never used  Substance Use Topics   Alcohol use: Yes    Comment: social   Drug use: No     Home Medications Prior to Admission medications   Medication Sig Start Date End Date Taking? Authorizing Provider  digoxin (LANOXIN) 0.125 MG tablet Take 0.0625 mg by mouth daily.     [provider]  diltiazem (CARDIZEM) 60 MG tablet Take 1 tablet (60 mg total) by mouth 2 (two) times daily. 04/30/18   Dixie Dials, MD  finasteride (PROSCAR) 5 MG tablet Take 5 mg by mouth daily.    [provider]  folic acid (FOLVITE) 093 MCG tablet Take 400 mcg by mouth daily.    [provider]  hydrochlorothiazide (HYDRODIURIL) 25 MG tablet Take 12.5 mg by mouth daily. 01/26/18   [provider]  Levothyroxine Sodium 75 MCG CAPS Take 75 mcg by mouth daily before breakfast.     [provider]  meclizine (ANTIVERT) 25 MG tablet Take 25 mg by mouth 3 (three) times daily as needed for dizziness.    [provider]  metoprolol tartrate (LOPRESSOR) 25 MG tablet Take 12.5 mg by mouth 2 (two) times daily.     [provider]  mirtazapine (REMERON) 15 MG tablet Take 15 mg by mouth at bedtime. 02/18/18   [provider]  Multiple Vitamins-Minerals (MULTIVITAMIN  WITH MINERALS) tablet Take 1 tablet by mouth at bedtime.     [provider]  nitroGLYCERIN (NITROSTAT) 0.4 MG SL tablet Place 1 tablet (0.4 mg total) under the tongue every 5 (five) minutes x 3 doses as needed for chest pain. 04/30/18   Dixie Dials, MD  rivaroxaban (XARELTO) 20 MG TABS tablet Take 20 mg by mouth at bedtime.    [provider]  tamsulosin (FLOMAX) 0.4 MG CAPS capsule Take 0.4 mg by mouth at bedtime.    [provider]  vitamin B-12 (CYANOCOBALAMIN) 1000 MCG tablet Take 1,000 mcg by mouth daily.    [provider]    Allergies    Darvon [propoxyphene] and Demerol [meperidine]  Review of Systems   Review of Systems  Constitutional: Negative.   HENT: Negative.    Respiratory: Negative.    Cardiovascular: Negative.    Gastrointestinal: Negative.   Genitourinary: Negative.   Musculoskeletal:  Positive for back pain and myalgias.  Skin: Negative.   Neurological: Negative.   Hematological:  Bruises/bleeds easily.  Psychiatric/Behavioral: Negative.     Physical Exam Updated Vital Signs BP (!) 174/98 (BP Location: Left Arm)   Pulse (!) 47   Temp 97.7 F (36.5 C) (Oral)   Resp 18   Ht 5\' 10"  (1.778 m)   Wt 83.9 kg   SpO2 100%   BMI 26.54 kg/m   Physical Exam Vitals and nursing note reviewed.  Constitutional:      Appearance: He is obese. He is not ill-appearing or toxic-appearing.  HENT:     Head: Normocephalic and atraumatic.     Nose: Nose normal. No congestion.     Mouth/Throat:     Mouth: Mucous membranes are moist.     Pharynx: No oropharyngeal exudate or posterior oropharyngeal erythema.  Eyes:     General:        Right eye: No discharge.        Left eye: No discharge.     Extraocular Movements: Extraocular movements intact.     Conjunctiva/sclera: Conjunctivae normal.     Pupils: Pupils are equal, round, and reactive to light.  Cardiovascular:     Rate and Rhythm: Normal rate and regular rhythm.     Pulses: Normal pulses.     Heart sounds: Normal heart sounds. No murmur heard. Pulmonary:     Effort: Pulmonary effort is normal. No respiratory distress.     Breath sounds: Normal breath sounds. No wheezing or rales.  Abdominal:     General: Bowel sounds are normal. There is no distension.     Palpations: Abdomen is soft.     Tenderness: There is no abdominal tenderness. There is no right CVA tenderness, guarding or rebound.  Musculoskeletal:        General: No deformity.     Cervical back: Normal and neck supple. No bony tenderness.     Thoracic back: Normal. No bony tenderness.     Lumbar back: Swelling, spasms and tenderness present. No bony tenderness.       Back:     Right lower leg: No edema.     Left lower leg: No edema.     Comments: Symmetric strength and sensation  in the lower extremities bilaterally.  Patient ambulatory with his walker which eventually walks at baseline.    Skin:    General: Skin is warm and dry.     Capillary Refill: Capillary refill takes less than 2 seconds.  Neurological:     General:  No focal deficit present.     Mental Status: He is alert and oriented to person, place, and time. Mental status is at baseline.  Psychiatric:        Mood and Affect: Mood normal.    ED Results / Procedures / Treatments   Labs (all labs ordered are listed, but only abnormal results are displayed) Labs Reviewed - No data to display  EKG None  Radiology No results found.  Procedures Procedures   Medications Ordered in ED Medications - No data to display  ED Course  I have reviewed the triage vital signs and the nursing notes.  Pertinent labs & imaging results that were available during my care of the patient were reviewed by me and considered in my medical decision making (see chart for details).    MDM Rules/Calculators/A&P                         87 year old male presents after mechanical fall.  Tylenol administered in triage.  Hypertensive on intake, vital signs otherwise normal.  Cardiopulmonary exam is normal, abdominal exam is benign.  Musculoskeletal exam revealed normal upper extremities, C-spine, T-spine.  Significant bruising and swelling over the lumbosacral area of the back without midline tenderness to palpation.  Significant swelling over the right posterior hip from the PSIS down to the greater trochanter as well, consistent with hematoma.  Mild associated tenderness palpation.  As patient is anticoagulant Xarelto and has significant hematoma on physical exam, will proceed with basic labs, as well consider CT of the abdomen pelvis.  CBC unremarkable, BMP unremarkable, coags with INR 1.8. Plain films of the hip were negative for acute osseous abnormality.  CT of the abdomen pelvis was negative for acute intra-abdominal  abnormality.  CT L-spine negative for acute fracture or dislocation there is spinal stenosis.  Patient requesting more Tylenol which was given.  No further work-up warranted in the ER at this time.  Suspect contusion with hematoma likely secondary to his anticoagulated status.  May follow-up with his primary care doctor.  No further work-up warranted in the ER at this time.  Marcus voiced understanding of his medical evaluation and treatment plan.  Each of his questions was answered to his expressed satisfaction.  Return precautions were given.  Patient is well-appearing, stable, and appropriate for discharge at this time  This chart was dictated using voice recognition software, Dragon. Despite the best efforts of this provider to proofread and correct errors, errors may still occur which can change documentation meaning.  Final Clinical Impression(s) / ED Diagnoses Final diagnoses:  None    Rx / DC Orders ED Discharge Orders     None        Emeline Darling, PA-C 01/26/21 0045    Fredia Sorrow, MD 01/28/21 [phone]

## 2021-01-25 NOTE — Discharge Instructions (Addendum)
You have a hematoma on your lower back. Please ice the area, and dress it with antibiotic ointment where you have a skin tear.   You  may use tylenol as needed for your pain. Return to the ER with any new severe symptoms.

## 2021-01-25 NOTE — ED Triage Notes (Signed)
Pt arrives with rolling walker to ED after tripping over a rug today states that when he landed he landed on a ceramic dog bowl, did not break not cuts to bottom but does have a large hematoma with bruising to waist ling/lower back. Pt also reports a skin tare to right inner arm that he reports putting a band-aid on PTA. Pt does take blood thinners, did not hit head, no LOC.

## 2022-06-01 IMAGING — CT CT L SPINE W/O CM
3 series · 9 of 33 positions shown, 11 images · IV contrast (Omnipaque)
Comparison: MRI lumbar spine 04/13/2013

CLINICAL DATA: Fall.  Abdominal trauma

EXAM:
CT LUMBAR SPINE WITHOUT CONTRAST
TECHNIQUE: Multidetector CT imaging of the lumbar spine was performed without
intravenous contrast administration. Multiplanar CT image
reconstructions were also generated.

[Series 1: axial st lumbar · axial · 0.53mm/px · z∈[-237,-237]mm · 1 of 143 slices shown, 2 images]
[im 77/143  soft-tissue]
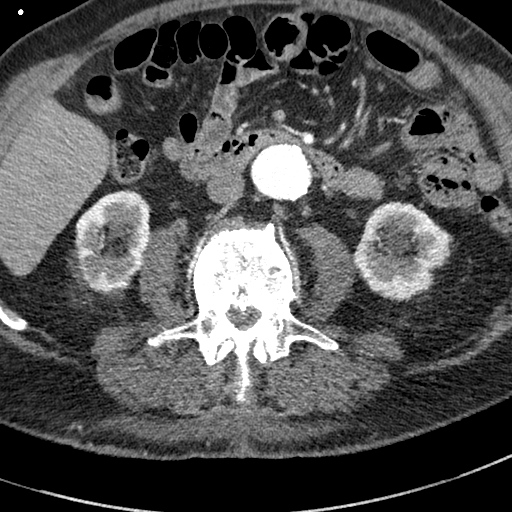
[im 77/143  bone]
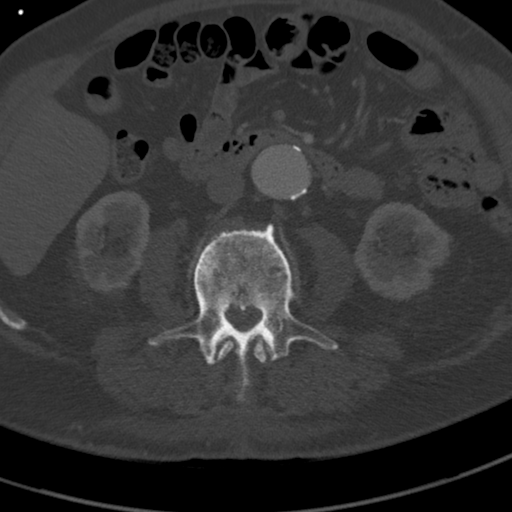

[Series 4: coronal lumbar · coronal · 0.37mm/px · 3 of 116 slices shown]
[im 24/116  bone]
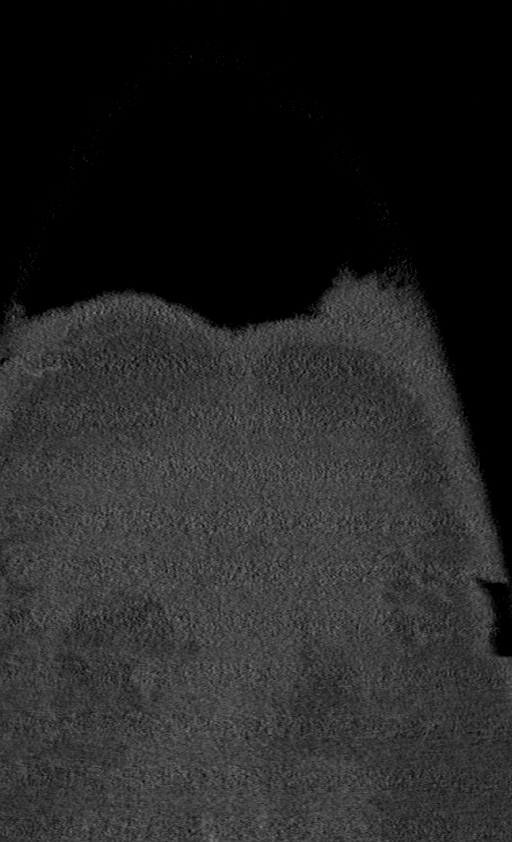
[im 47/116  bone]
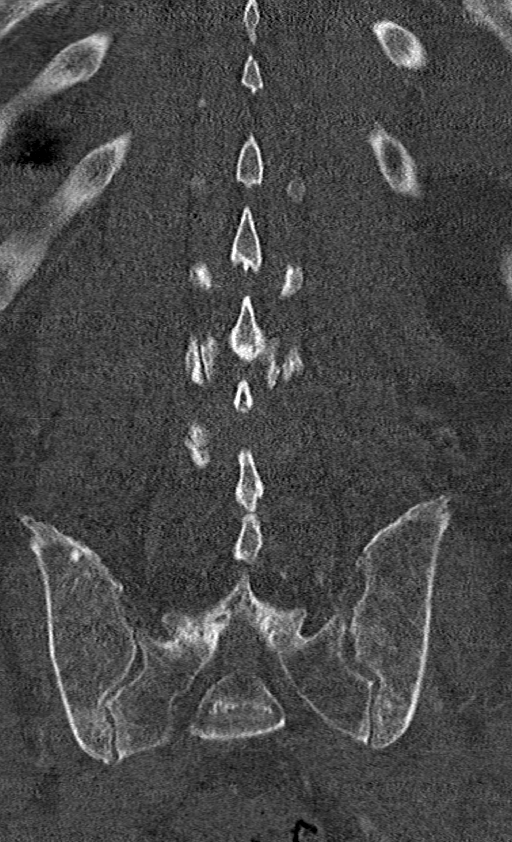
[im 70/116  bone]
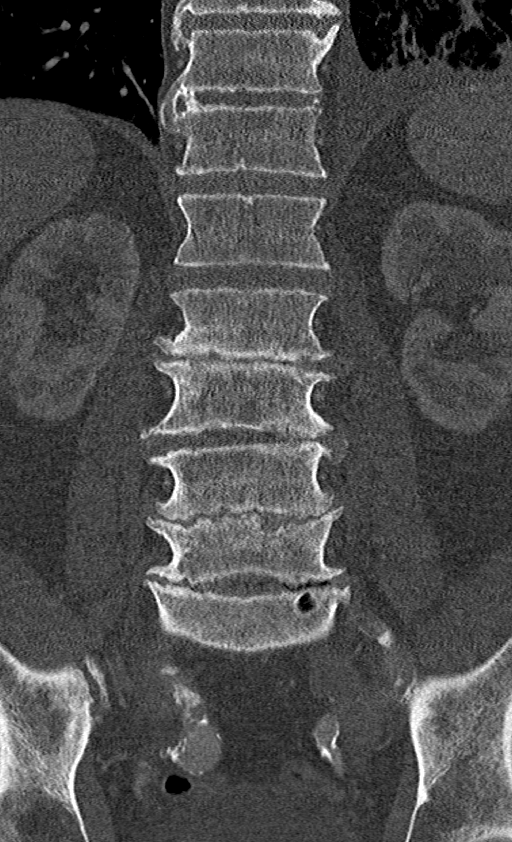

[Series 5: sagittalbone lumbar · sagittal · 0.32mm/px · 5 of 83 slices shown, 6 images]
[im 28/83  bone]
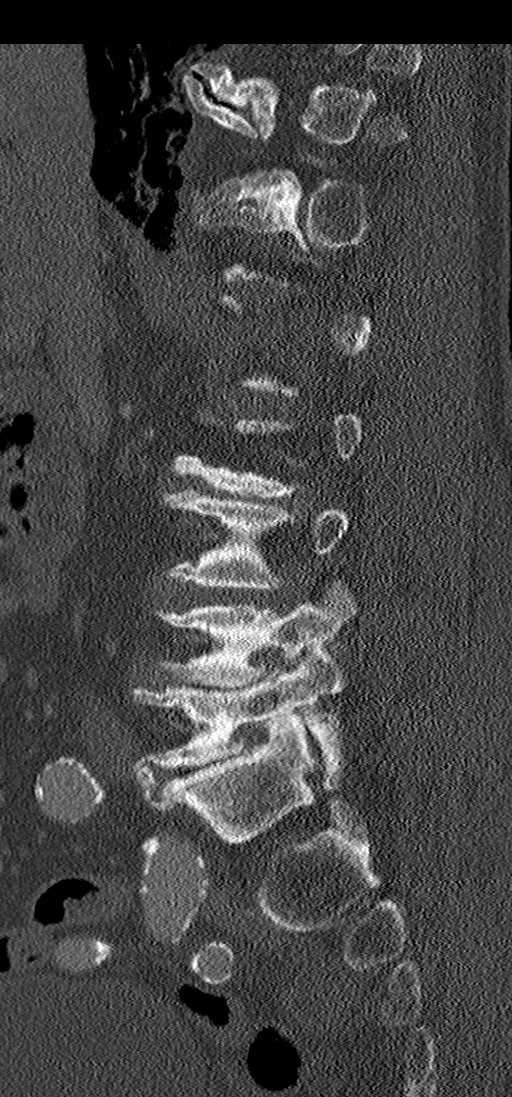
[im 35/83  bone]
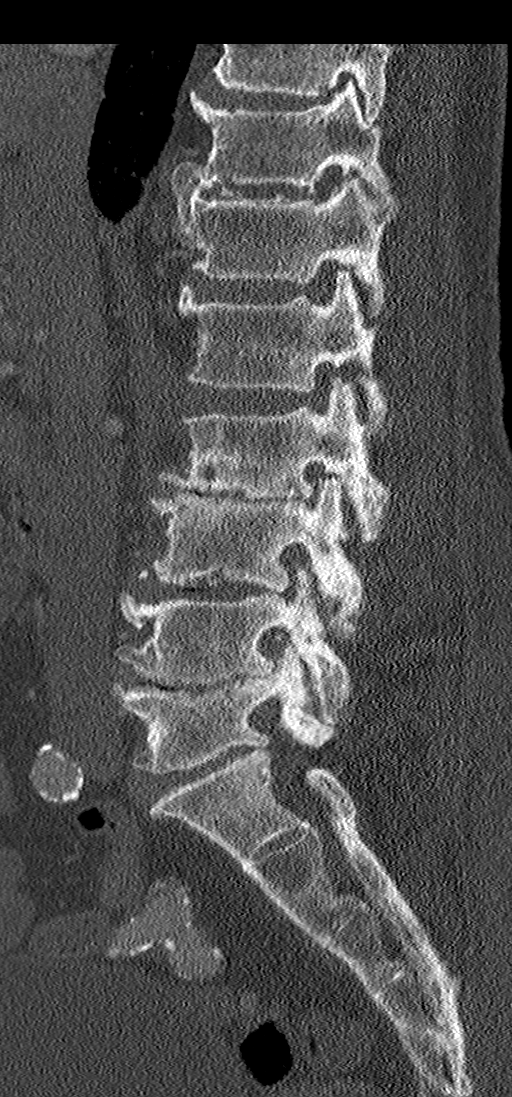
[im 42/83  soft-tissue]
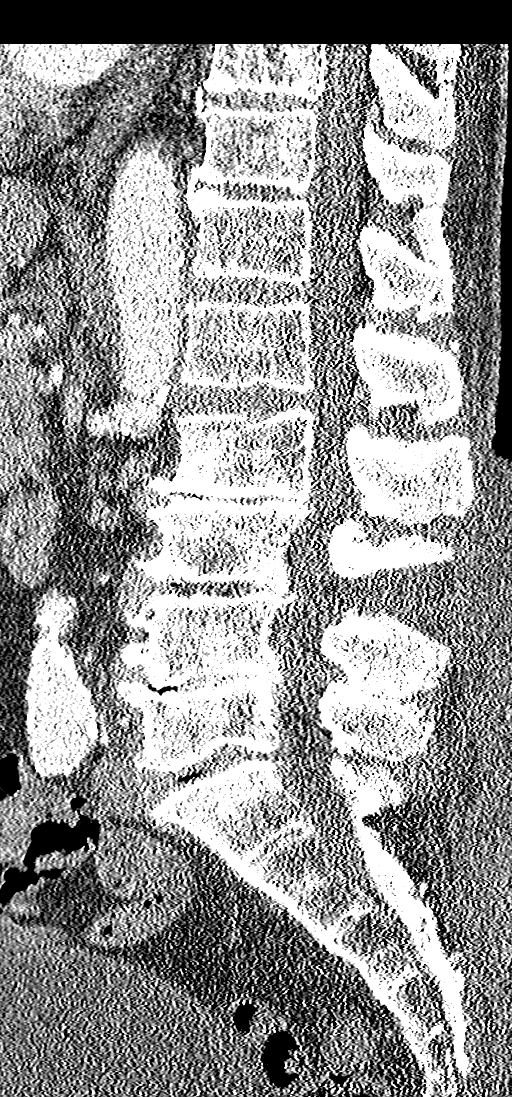
[im 42/83  bone]
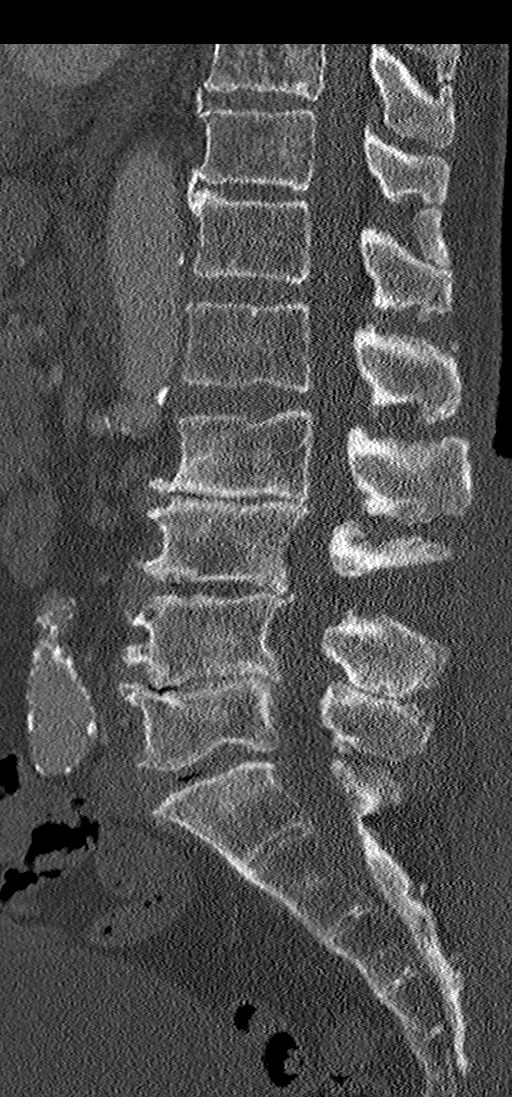
[im 48/83  bone]
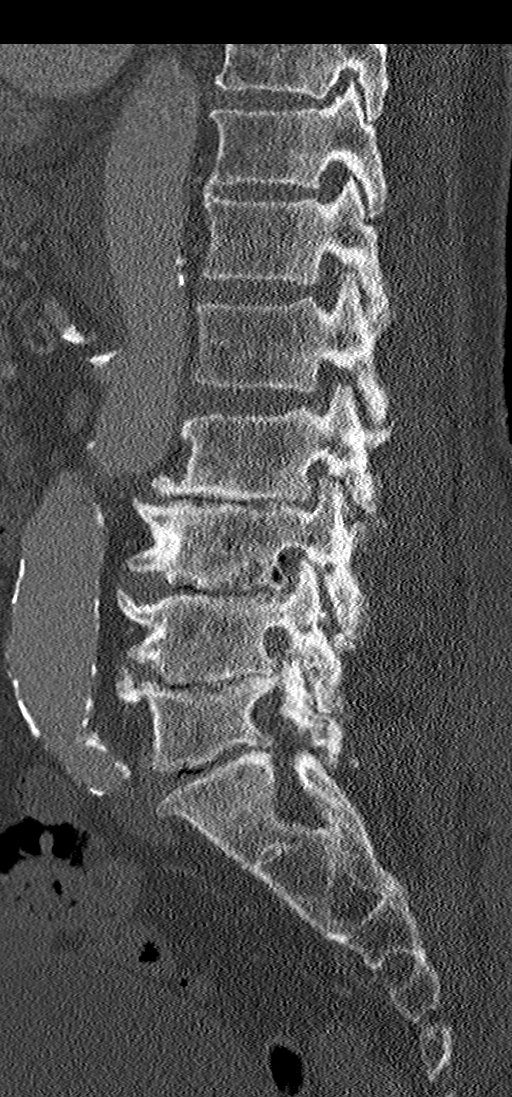
[im 55/83  bone]
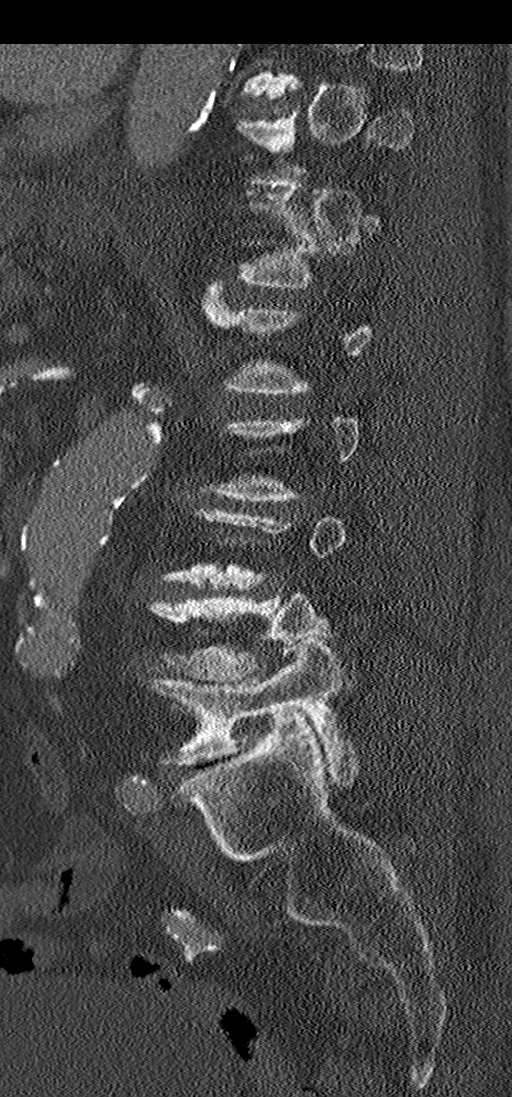

[9 of 33 positions shown; findings below may reference images not displayed]

FINDINGS: Segmentation: 5 lumbar segments

Alignment: Normal alignment with straightening of the lumbar
lordosis

Vertebrae: Negative for fracture or mass

Paraspinal and other soft tissues: Atherosclerotic aorta and iliacs.
Abdominal aorta measures 28 mm in diameter. Right iliac artery 13
mm. Left iliac artery 13 mm

Negative for paraspinous mass, adenopathy, or hematoma

Disc levels: L1-2: Diffuse disc bulging and facet degeneration. Mild
spinal stenosis. Mild to moderate foraminal stenosis bilaterally

L2-3: Advanced disc degeneration and spurring. Mild to moderate
spinal stenosis. Bilateral foraminal stenosis.

L3-4: Bilateral laminectomy. Disc degeneration with diffuse endplate
spurring and bilateral facet degeneration. Spinal canal
decompressed. Moderate to severe foraminal encroachment bilaterally.

L4-5: Advanced disc degeneration with diffuse endplate spurring and
bilateral facet degeneration. Mild spinal stenosis. Moderate
subarticular and foraminal stenosis bilaterally

L5-S1: Disc degeneration with endplate spurring. Bilateral facet
hypertrophy. Moderate to severe foraminal encroachment bilaterally.
IMPRESSION: Negative for lumbar fracture

Multilevel spondylosis and spinal stenosis

Abdominal aorta measures 28 mm in diameter. Recommend follow-up
ultrasound every 5 years. This recommendation follows ACR consensus
guidelines: White Paper of the ACR Incidental Findings Committee II

## 2024-04-04 ENCOUNTER — Ambulatory Visit
Admission: EM | Admit: 2024-04-04 | Discharge: 2024-04-04 | Disposition: A | Discharge status: Home / Self Care | Attending: Family Medicine | Admitting: Family Medicine

## 2024-04-04 ENCOUNTER — Other Ambulatory Visit: Payer: Self-pay

## 2024-04-04 DIAGNOSIS — L03213 Periorbital cellulitis: Secondary | ICD-10-CM

## 2024-04-04 DIAGNOSIS — H00022 Hordeolum internum right lower eyelid: Secondary | ICD-10-CM

## 2024-04-04 MED ORDER — AMOXICILLIN-POT CLAVULANATE 875-125 MG PO TABS: 1.0000 | ORAL_TABLET | Freq: Two times a day (BID) | ORAL | 0 refills | Status: AC

## 2024-04-04 NOTE — Discharge Instructions (Addendum)
 Give the Augmentin  antibiotic 2 times a day.  Give with food. Consider taking a probiotic while on this antibiotic to protect your stomach.  Sometimes can cause diarrhea Follow-up with your usual eye doctor next week, or as soon as can be arranged

## 2024-04-04 NOTE — ED Triage Notes (Signed)
 Patient reports that he has had right eye redness since 03/26/24 and worsening. Patient denies blurred vision. Patient has redness and swelling radiating into the right cheek area.   Patient has been using ice pack to the area.

## 2024-04-04 NOTE — ED Provider Notes (Addendum)
 " Corey Marshall    CSN: [id number] Arrival date & time: 04/04/24  1028      History   Chief Complaint Chief Complaint  Patient presents with   eye issue    HPI Corey Marshall is a 89 y.o. male.   HPI  Patient lives in [place] .  Is here visiting for the holiday.  Is brought in by family for evaluation of redness and swelling in his right eye.  He states is tender to touch but otherwise he does not have any pain.  No problems with visual acuity  Past Medical History:  Diagnosis Date   Atrial fibrillation ([facility])    Cancer ([facility])    HS OF THROAT CANCER   Complication of anesthesia    Enlarged prostate    Family history of adverse reaction to anesthesia    NAUSEA   Hypertension    Hypothyroidism    Peripheral neuralgia    PONV (postoperative nausea and vomiting)    Trigger finger     Patient Active Problem List   Diagnosis Date Noted   Acute coronary syndrome ([facility]) 04/29/2018   Weakness generalized 07/19/2016   ARF (acute renal failure) 07/19/2016   PAF (paroxysmal atrial fibrillation) ([facility]) 07/19/2016   Essential hypertension 07/19/2016   Normocytic anemia 07/19/2016   History of throat cancer 07/19/2016    Past Surgical History:  Procedure Laterality Date   APPENDECTOMY     CARPAL TUNNEL RELEASE Bilateral    JOINT REPLACEMENT     KNEE SURGERY     throat cancer surgery         Home Medications    Prior to Admission medications  Medication Sig Start Date End Date Taking? Authorizing Provider  amoxicillin -clavulanate (AUGMENTIN ) 875-125 MG tablet Take 1 tablet by mouth every 12 (twelve) hours. 04/04/24  Yes Maranda Jamee Jacob, MD  warfarin (COUMADIN) 1 MG tablet Take 1 mg by mouth daily.   Yes [provider]  digoxin  (LANOXIN ) 0.125 MG tablet Take 0.0625 mg by mouth daily.     [provider]  diltiazem  (CARDIZEM ) 60 MG tablet Take 1 tablet (60 mg total) by mouth 2 (two) times daily. 04/30/18   Claudene Pacific, MD  finasteride   (PROSCAR ) 5 MG tablet Take 5 mg by mouth daily.    [provider]  folic acid  (FOLVITE ) 400 MCG tablet Take 400 mcg by mouth daily.    [provider]  hydrochlorothiazide  (HYDRODIURIL ) 25 MG tablet Take 12.5 mg by mouth daily. 01/26/18   [provider]  Levothyroxine  Sodium 75 MCG CAPS Take 75 mcg by mouth daily before breakfast.     [provider]  meclizine  (ANTIVERT ) 25 MG tablet Take 25 mg by mouth 3 (three) times daily as needed for dizziness.    [provider]  metoprolol  tartrate (LOPRESSOR ) 25 MG tablet Take 12.5 mg by mouth 2 (two) times daily.     [provider]  mirtazapine  (REMERON ) 15 MG tablet Take 15 mg by mouth at bedtime. 02/18/18   [provider]  Multiple Vitamins-Minerals (MULTIVITAMIN WITH MINERALS) tablet Take 1 tablet by mouth at bedtime.     [provider]  nitroGLYCERIN  (NITROSTAT ) 0.4 MG SL tablet Place 1 tablet (0.4 mg total) under the tongue every 5 (five) minutes x 3 doses as needed for chest pain. 04/30/18   Claudene Pacific, MD  tamsulosin  (FLOMAX ) 0.4 MG CAPS capsule Take 0.4 mg by mouth at bedtime.    [provider]  vitamin B-12 (  CYANOCOBALAMIN ) 1000 MCG tablet Take 1,000 mcg by mouth daily.    [provider]    Family History Family History  Problem Relation Age of Onset   CAD Neg Hx    Diabetes Mellitus II Neg Hx     Social History Social History[1]   Allergies   Darvon [propoxyphene] and Demerol [meperidine]   Review of Systems Review of Systems  See HPI Physical Exam Triage Vital Signs ED Triage Vitals  Encounter Vitals Group     BP 04/04/24 1057 127/82     Girls Systolic BP Percentile --      Girls Diastolic BP Percentile --      Boys Systolic BP Percentile --      Boys Diastolic BP Percentile --      Pulse Rate 04/04/24 1057 68     Resp 04/04/24 1057 16     Temp 04/04/24 1057 98.4 F (36.9 C)     Temp Source 04/04/24 1057 Oral     SpO2  04/04/24 1057 98 %     Weight --      Height --      Head Circumference --      Peak Flow --      Pain Score 04/04/24 1101 2     Pain Loc --      Pain Education --      Exclude from Growth Chart --    No data found.  Updated Vital Signs BP 127/82   Pulse 68   Temp 98.4 F (36.9 C) (Oral)   Resp 16   SpO2 98%       Physical Exam Constitutional:      General: He is not in acute distress.    Appearance: He is well-developed.  HENT:     Head: Normocephalic and atraumatic.  Eyes:     General: Vision grossly intact. Gaze aligned appropriately.        Right eye: Hordeolum present.     Conjunctiva/sclera: Conjunctivae normal.     Pupils: Pupils are equal, round, and reactive to light.      Comments: Ectropion is present.  There are 2 visible hordeolum on the medial portion of the lower lid.  No discharge.  In addition there is redness and tenderness down into the right cheek about 4 to 5 cm  Cardiovascular:     Rate and Rhythm: Normal rate.  Pulmonary:     Effort: Pulmonary effort is normal. No respiratory distress.  Musculoskeletal:        General: Normal range of motion.     Cervical back: Normal range of motion.  Skin:    General: Skin is warm and dry.  Neurological:     Mental Status: He is alert.      UC Treatments / Results  Labs (all labs ordered are listed, but only abnormal results are displayed) Labs Reviewed - No data to display  EKG   Radiology No results found.  Procedures Procedures (including critical Marshall time)  Medications Ordered in UC Medications - No data to display  Initial Impression / Assessment and Plan / UC Course  I have reviewed the triage vital signs and the nursing notes.  Pertinent labs & imaging results that were available during my Marshall of the patient were reviewed by me and considered in my medical decision making (see chart for details).     Concern for spreading redness in cheek.  I believe he needs oral antibiotics.   Eye follow-up is  emphasized Because of patient's age I did look up his most recent creatinine and GFR measurements.  I wanted to know if he would benefit from a decreased dose of antibiotics.  It appears he can take full adult dose. Final Clinical Impressions(s) / UC Diagnoses   Final diagnoses:  Hordeolum internum of right lower eyelid  Periorbital cellulitis of right eye     Discharge Instructions      Give the Augmentin  antibiotic 2 times a day.  Give with food. Consider taking a probiotic while on this antibiotic to protect your stomach.  Sometimes can cause diarrhea Follow-up with your usual eye doctor next week, or as soon as can be arranged   ED Prescriptions     Medication Sig Dispense Auth. Provider   amoxicillin -clavulanate (AUGMENTIN ) 875-125 MG tablet Take 1 tablet by mouth every 12 (twelve) hours. 14 tablet Maranda Jamee Jacob, MD      PDMP not reviewed this encounter.    Maranda Jamee Jacob, MD 04/04/24 1639     [1]  Social History Tobacco Use   Smoking status: Former   Smokeless tobacco: Never  Vaping Use   Vaping status: Never Used  Substance Use Topics   Alcohol use: Yes    Comment: social   Drug use: No     Maranda Jamee Jacob, MD 04/04/24 1640  "
